# Patient Record
Sex: Female | Born: 1980 | Race: Black or African American | Hispanic: No | Smoking: Current some day smoker
Health system: Southern US, Community
[De-identification: ages and names within clinical notes are randomized; demographics above are authoritative.]

## PROBLEM LIST (undated history)

## (undated) HISTORY — PX: TUBAL LIGATION: SHX77

## (undated) HISTORY — PX: TONSILLECTOMY: SUR1361

---

## 2012-11-29 ENCOUNTER — Encounter: Payer: Self-pay | Admitting: Emergency Medicine

## 2012-11-29 ENCOUNTER — Ambulatory Visit: Payer: Self-pay

## 2012-11-29 ENCOUNTER — Ambulatory Visit (INDEPENDENT_AMBULATORY_CARE_PROVIDER_SITE_OTHER): Payer: Self-pay | Admitting: Emergency Medicine

## 2012-11-29 VITALS — BP 130/76 | HR 86 | Resp 18

## 2012-11-29 DIAGNOSIS — R0602 Shortness of breath: Secondary | ICD-10-CM

## 2012-11-29 DIAGNOSIS — R112 Nausea with vomiting, unspecified: Secondary | ICD-10-CM

## 2012-11-29 DIAGNOSIS — J45901 Unspecified asthma with (acute) exacerbation: Secondary | ICD-10-CM

## 2012-11-29 LAB — COMPREHENSIVE METABOLIC PANEL
ALT: 14 U/L (ref 0–35)
Calcium: 8.8 mg/dL (ref 8.4–10.5)
Creat: 0.84 mg/dL (ref 0.50–1.10)
Glucose, Bld: 108 mg/dL — ABNORMAL HIGH (ref 70–99)
Potassium: 4.2 mEq/L (ref 3.5–5.3)
Sodium: 139 mEq/L (ref 135–145)

## 2012-11-29 LAB — POCT CBC
Granulocyte percent: 80.2 %G — AB (ref 37–80)
HCT, POC: 40.4 % (ref 37.7–47.9)
Lymph, poc: 1.5 (ref 0.6–3.4)
MCHC: 30.7 g/dL — AB (ref 31.8–35.4)
MID (cbc): 0.5 (ref 0–0.9)
MPV: 10.5 fL (ref 0–99.8)
POC Granulocyte: 7.9 — AB (ref 2–6.9)
POC LYMPH PERCENT: 14.7 %L (ref 10–50)
POC MID %: 5.1 %M (ref 0–12)
Platelet Count, POC: 215 10*3/uL (ref 142–424)
RDW, POC: 15.3 %

## 2012-11-29 MED ORDER — IPRATROPIUM BROMIDE 0.02 % IN SOLN
0.5000 mg | Freq: Once | RESPIRATORY_TRACT | Status: AC
  Administered 2012-11-29: 0.5 mg via RESPIRATORY_TRACT

## 2012-11-29 MED ORDER — BENZONATATE 100 MG PO CAPS: 100.0000 mg | ORAL_CAPSULE | Freq: Three times a day (TID) | ORAL | Status: DC | PRN

## 2012-11-29 MED ORDER — ALBUTEROL SULFATE HFA 108 (90 BASE) MCG/ACT IN AERS: 2.0000 | INHALATION_SPRAY | RESPIRATORY_TRACT | Status: DC | PRN

## 2012-11-29 MED ORDER — ALBUTEROL SULFATE (2.5 MG/3ML) 0.083% IN NEBU
2.5000 mg | INHALATION_SOLUTION | Freq: Once | RESPIRATORY_TRACT | Status: AC
  Administered 2012-11-29: 2.5 mg via RESPIRATORY_TRACT

## 2012-11-29 MED ORDER — ONDANSETRON 4 MG PO TBDP: ORAL_TABLET | ORAL | Status: DC

## 2012-11-29 NOTE — Progress Notes (Addendum)
  Subjective:    Patient ID: Abigail Paul, female    DOB: [birth year], 32 y.o.   MRN: [id number]  HPI patient brought back emergently stating she was unable to breathe. She's been short of breath with wheezing today. She has been ill the last 2 to 3 days with nausea and vomiting but no definite fever. She has a history of needing to use an inhaler in the past but not recently. She does not carry a diagnosis of asthma. She is status post tubal ligation. The patient is currently exhausted. She's been working steadily. She has 2 children and is going through a  divorce. She took some cough syrup this morning this also may have made her sick. She denies any chest pain she's only had the nausea and vomiting.     Review of Systems     Objective:   Physical Exam patient has audible wheezing on exam. Her neck was supple. Her throat is normal. Her cardiac exam irregular rate no murmurs. The abdomen was soft liver spleen not enlarged. There is no abdominal tenderness. There are no masses. Her calves are nontender. There is no calf swelling.  Results for orders placed in visit on 11/29/12  POCT CBC      Result Value Range   WBC 9.9  4.6 - 10.2 K/uL   Lymph, poc 1.5  0.6 - 3.4   POC LYMPH PERCENT 14.7  10 - 50 %L   MID (cbc) 0.5  0 - 0.9   POC MID % 5.1  0 - 12 %M   POC Granulocyte 7.9 (*) 2 - 6.9   Granulocyte percent 80.2 (*) 37 - 80 %G   RBC 4.78  4.04 - 5.48 M/uL   Hemoglobin 12.4  12.2 - 16.2 g/dL   HCT, POC 46.9  62.9 - 47.9 %   MCV 84.5  80 - 97 fL   MCH, POC 25.9 (*) 27 - 31.2 pg   MCHC 30.7 (*) 31.8 - 35.4 g/dL   RDW, POC 52.8     Platelet Count, POC 215  142 - 424 K/uL   MPV 10.5  0 - 99.8 fL   [facility] reading (PRIMARY) by  Dr. Cleta Alberts no pneumonia seen air fields appear clear heart size is normal On repeat exam patient was improved after 2 nebulizer treatments. Breath sounds were symmetrical her peak flow was 400     Assessment & Plan:  White count is normal but with a left shift. I  suspect she caught an illness from her daughter. We'll treat with Zofran, albuterol inhaler,tessalon perles, and force fluids. She was given 2 treatments here in the office and will be out of work for 2 days.  Called to check on patient. Some cough and tightness. No fever. Keeping fluids down. To call if any worsening of symptoms.Call at 7:45.

## 2013-01-02 ENCOUNTER — Encounter: Payer: Self-pay | Admitting: Emergency Medicine

## 2013-11-22 ENCOUNTER — Ambulatory Visit (INDEPENDENT_AMBULATORY_CARE_PROVIDER_SITE_OTHER): Payer: 59

## 2013-11-22 ENCOUNTER — Ambulatory Visit (INDEPENDENT_AMBULATORY_CARE_PROVIDER_SITE_OTHER): Payer: 59 | Admitting: Family Medicine

## 2013-11-22 VITALS — BP 136/68 | HR 120 | Temp 98.3°F | Resp 16

## 2013-11-22 DIAGNOSIS — R079 Chest pain, unspecified: Secondary | ICD-10-CM

## 2013-11-22 NOTE — Patient Instructions (Signed)
1. Let me know if you develop shortness of breath, sweating, light-headedness, nausea with the chest pain.  Chest Pain (Nonspecific) It is often hard to give a specific diagnosis for the cause of chest pain. There is always a chance that your pain could be related to something serious, such as a heart attack or a blood clot in the lungs. You need to follow up with your health care provider for further evaluation. CAUSES   Heartburn.  Pneumonia or bronchitis.  Anxiety or stress.  Inflammation around your heart (pericarditis) or lung (pleuritis or pleurisy).  A blood clot in the lung.  A collapsed lung (pneumothorax). It can develop suddenly on its own (spontaneous pneumothorax) or from trauma to the chest.  Shingles infection (herpes zoster virus). The chest wall is composed of bones, muscles, and cartilage. Any of these can be the source of the pain.  The bones can be bruised by injury.  The muscles or cartilage can be strained by coughing or overwork.  The cartilage can be affected by inflammation and become sore (costochondritis). DIAGNOSIS  Lab tests or other studies may be needed to find the cause of your pain. Your health care provider may have you take a test called an ambulatory electrocardiogram (ECG). An ECG records your heartbeat patterns over a 24-hour period. You may also have other tests, such as:  Transthoracic echocardiogram (TTE). During echocardiography, sound waves are used to evaluate how blood flows through your heart.  Transesophageal echocardiogram (TEE).  Cardiac monitoring. This allows your health care provider to monitor your heart rate and rhythm in real time.  Holter monitor. This is a portable device that records your heartbeat and can help diagnose heart arrhythmias. It allows your health care provider to track your heart activity for several days, if needed.  Stress tests by exercise or by giving medicine that makes the heart beat faster. TREATMENT     Treatment depends on what may be causing your chest pain. Treatment may include:  Acid blockers for heartburn.  Anti-inflammatory medicine.  Pain medicine for inflammatory conditions.  Antibiotics if an infection is present.  You may be advised to change lifestyle habits. This includes stopping smoking and avoiding alcohol, caffeine, and chocolate.  You may be advised to keep your head raised (elevated) when sleeping. This reduces the chance of acid going backward from your stomach into your esophagus. Most of the time, nonspecific chest pain will improve within 2-3 days with rest and mild pain medicine.  HOME CARE INSTRUCTIONS   If antibiotics were prescribed, take them as directed. Finish them even if you start to feel better.  For the next few days, avoid physical activities that bring on chest pain. Continue physical activities as directed.  Do not use any tobacco products, including cigarettes, chewing tobacco, or electronic cigarettes.  Avoid drinking alcohol.  Only take medicine as directed by your health care provider.  Follow your health care provider's suggestions for further testing if your chest pain does not go away.  Keep any follow-up appointments you made. If you do not go to an appointment, you could develop lasting (chronic) problems with pain. If there is any problem keeping an appointment, call to reschedule. SEEK MEDICAL CARE IF:   Your chest pain does not go away, even after treatment.  You have a rash with blisters on your chest.  You have a fever. SEEK IMMEDIATE MEDICAL CARE IF:   You have increased chest pain or pain that spreads to your arm, neck,  jaw, back, or abdomen.  You have shortness of breath.  You have an increasing cough, or you cough up blood.  You have severe back or abdominal pain.  You feel nauseous or vomit.  You have severe weakness.  You faint.  You have chills. This is an emergency. Do not wait to see if the pain will  go away. Get medical help at once. Call your local emergency services (911 in [place] Do not drive yourself to the hospital. MAKE SURE YOU:   Understand these instructions.  Will watch your condition.  Will get help right away if you are not doing well or get worse. Document Released: 02/10/2005 Document Revised: 05/08/2013 Document Reviewed: 12/07/2007 [facility] Patient Information 2015 [place], [place]. This information is not intended to replace advice given to you by your health care provider. Make sure you discuss any questions you have with your health care provider.

## 2013-11-22 NOTE — Progress Notes (Signed)
Subjective:    Patient ID: Abigail Paul, female    DOB: 04-15-1981, 33 y.o.   MRN: [id number]  11/22/2013  Chest Pain   Chest Pain  Pertinent negatives include no abdominal pain, cough, diaphoresis, fever, nausea, palpitations, shortness of breath or vomiting.   This 33 y.o. female presents for evaluation of chest pain. At work, with acute onset of sharp shooting chest pain located under L breast with radiation into L arm.  No associated SOB, diaphoresis, nausea.  Duration of chest pain 1 minute; had six episodes in fifteen minutes.  Can occur at rest or with standing; no exertional component.  No recent surgeries or prolonged immobilization. No heartburn or indigestion.  No abdominal pain.  No heavy lifting or pushing lately.  Admits to increased stressors; single mother raising children; lives with grandmother which is very stressful.  Daughter with moderate to severe asthma which requires multiple admissions; pt has missed a lot of work due to daughter's hospitalizations. Trying to get FMLA for daughter's absences.  No family history of coronary artery disease or pulmonary embolism.    Treated last week for B otitis media.  No cough with acute illness.   Feeling better.  PCP: [facility]   Review of Systems  Constitutional: Negative for fever, chills, diaphoresis and fatigue.  Respiratory: Negative for cough, shortness of breath, wheezing and stridor.   Cardiovascular: Positive for chest pain. Negative for palpitations and leg swelling.  Gastrointestinal: Negative for nausea, vomiting and abdominal pain.    History reviewed. No pertinent past medical history. Allergies  Allergen Reactions  . Amoxicillin   . Penicillins    Past Surgical History  Procedure Laterality Date  . Tubal ligation    . Tonsillectomy      Current Outpatient Prescriptions  Medication Sig Dispense Refill  . albuterol (PROVENTIL HFA;VENTOLIN HFA) 108 (90 BASE) MCG/ACT inhaler Inhale 2 puffs into the lungs  every 4 (four) hours as needed for wheezing (cough, shortness of breath or wheezing.).  1 Inhaler  1  . benzonatate (TESSALON) 100 MG capsule Take 1-2 capsules (100-200 mg total) by mouth 3 (three) times daily as needed for cough.  40 capsule  0  . ondansetron (ZOFRAN ODT) 4 MG disintegrating tablet Take 1-2 tablets every 6-8 hours as needed for nausea  20 tablet  0   No current facility-administered medications for this visit.   History   Social History  . Marital Status: Divorced    Spouse Name: N/A    Number of Children: N/A  . Years of Education: N/A   Occupational History  . Not on file.   Social History Main Topics  . Smoking status: Current Some Day Smoker -- 10.00 packs/day    Types: Cigarettes  . Smokeless tobacco: Not on file  . Alcohol Use: 1.8 oz/week    3 Shots of liquor per week  . Drug Use: No  . Sexual Activity: Not on file   Other Topics Concern  . Not on file   Social History Narrative  . No narrative on file   Family History  Problem Relation Age of Onset  . Asthma Mother        Objective:    BP 136/68  Pulse 120  Temp(Src) 98.3 F (36.8 C) (Oral)  Resp 16  SpO2 97%  LMP 10/25/2013 Physical Exam  Constitutional: She is oriented to person, place, and time. She appears well-developed and well-nourished. No distress.  HENT:  Head: Normocephalic and atraumatic.  Right Ear: External  ear normal.  Left Ear: External ear normal.  Nose: Nose normal.  Mouth/Throat: Oropharynx is clear and moist.  Eyes: Conjunctivae and EOM are normal. Pupils are equal, round, and reactive to light.  Neck: Normal range of motion. Neck supple. Carotid bruit is not present. No thyromegaly present.  Cardiovascular: Normal rate, regular rhythm and intact distal pulses.  Exam reveals no gallop and no friction rub.   Murmur heard.  Systolic murmur is present with a grade of 2/6  II/VI holosystolic murmur.  Pulmonary/Chest: Effort normal and breath sounds normal. She has  no wheezes. She has no rales. She exhibits no tenderness.  Abdominal: Soft. Bowel sounds are normal. She exhibits no distension and no mass. There is no tenderness. There is no rebound and no guarding.  Musculoskeletal:       Right shoulder: Normal.       Left shoulder: Normal.       Cervical back: She exhibits normal range of motion, no tenderness, no bony tenderness, no pain and no spasm.       Lumbar back: Normal. She exhibits normal range of motion, no tenderness and no bony tenderness.  Lymphadenopathy:    She has no cervical adenopathy.  Neurological: She is alert and oriented to person, place, and time. No cranial nerve deficit.  Skin: Skin is warm and dry. No rash noted. She is not diaphoretic. No erythema. No pallor.  Psychiatric: She has a normal mood and affect. Her behavior is normal.   Results for orders placed in visit on 11/22/13  CBC WITH DIFFERENTIAL      Result Value Ref Range   WBC 9.7  4.0 - 10.5 K/uL   RBC 4.98  3.87 - 5.11 MIL/uL   Hemoglobin 12.2  12.0 - 15.0 g/dL   HCT 37.4  36.0 - 46.0 %   MCV 75.1 (*) 78.0 - 100.0 fL   MCH 24.5 (*) 26.0 - 34.0 pg   MCHC 32.6  30.0 - 36.0 g/dL   RDW 15.2  11.5 - 15.5 %   Platelets 285  150 - 400 K/uL   Neutrophils Relative % 69  43 - 77 %   Neutro Abs 6.7  1.7 - 7.7 K/uL   Lymphocytes Relative 24  12 - 46 %   Lymphs Abs 2.3  0.7 - 4.0 K/uL   Monocytes Relative 5  3 - 12 %   Monocytes Absolute 0.5  0.1 - 1.0 K/uL   Eosinophils Relative 2  0 - 5 %   Eosinophils Absolute 0.2  0.0 - 0.7 K/uL   Basophils Relative 0  0 - 1 %   Basophils Absolute 0.0  0.0 - 0.1 K/uL   Smear Review Criteria for review not met    COMPREHENSIVE METABOLIC PANEL      Result Value Ref Range   Sodium 140  135 - 145 mEq/L   Potassium 4.4  3.5 - 5.3 mEq/L   Chloride 106  96 - 112 mEq/L   CO2 25  19 - 32 mEq/L   Glucose, Bld 99  70 - 99 mg/dL   BUN 13  6 - 23 mg/dL   Creat 0.93  0.50 - 1.10 mg/dL   Total Bilirubin 0.3  0.2 - 1.2 mg/dL   Alkaline  Phosphatase 76  39 - 117 U/L   AST 11  0 - 37 U/L   ALT 10  0 - 35 U/L   Total Protein 6.6  6.0 - 8.3 g/dL   Albumin  3.7  3.5 - 5.2 g/dL   Calcium 8.9  8.4 - 10.5 mg/dL   EKG: sinus tachycardia; rate 102; no ST changes.  [facility] reading (PRIMARY) by  Dr. Tamala Julian.  CXR: NAD      Assessment & Plan:  Chest pain, unspecified chest pain type - Plan: EKG 12-Lead, CBC with Differential, Comprehensive metabolic panel, DG Chest 2 View  1. Chest pain:  New. Atypical in nature.  Non-exertional. Ddx includes acute stress reaction versus musculoskeletal etiology.  Obtain labs to rule out secondary causes of chest pain.  RTC for acute worsening.  No orders of the defined types were placed in this encounter.    No Follow-up on file.   Reginia Forts, M.D.  [facility] [address] [place], [place]  [address] [phone] phone [phone] fax

## 2013-11-23 LAB — CBC WITH DIFFERENTIAL/PLATELET
BASOS PCT: 0 % (ref 0–1)
Basophils Absolute: 0 10*3/uL (ref 0.0–0.1)
Eosinophils Absolute: 0.2 10*3/uL (ref 0.0–0.7)
Eosinophils Relative: 2 % (ref 0–5)
HCT: 37.4 % (ref 36.0–46.0)
HEMOGLOBIN: 12.2 g/dL (ref 12.0–15.0)
Lymphocytes Relative: 24 % (ref 12–46)
Lymphs Abs: 2.3 10*3/uL (ref 0.7–4.0)
MCH: 24.5 pg — ABNORMAL LOW (ref 26.0–34.0)
MCHC: 32.6 g/dL (ref 30.0–36.0)
MCV: 75.1 fL — ABNORMAL LOW (ref 78.0–100.0)
MONOS PCT: 5 % (ref 3–12)
Monocytes Absolute: 0.5 10*3/uL (ref 0.1–1.0)
NEUTROS ABS: 6.7 10*3/uL (ref 1.7–7.7)
Neutrophils Relative %: 69 % (ref 43–77)
PLATELETS: 285 10*3/uL (ref 150–400)
RBC: 4.98 MIL/uL (ref 3.87–5.11)
RDW: 15.2 % (ref 11.5–15.5)
WBC: 9.7 10*3/uL (ref 4.0–10.5)

## 2013-11-23 LAB — COMPREHENSIVE METABOLIC PANEL
ALBUMIN: 3.7 g/dL (ref 3.5–5.2)
ALK PHOS: 76 U/L (ref 39–117)
ALT: 10 U/L (ref 0–35)
AST: 11 U/L (ref 0–37)
BILIRUBIN TOTAL: 0.3 mg/dL (ref 0.2–1.2)
BUN: 13 mg/dL (ref 6–23)
CO2: 25 mEq/L (ref 19–32)
Calcium: 8.9 mg/dL (ref 8.4–10.5)
Chloride: 106 mEq/L (ref 96–112)
Creat: 0.93 mg/dL (ref 0.50–1.10)
Glucose, Bld: 99 mg/dL (ref 70–99)
Potassium: 4.4 mEq/L (ref 3.5–5.3)
SODIUM: 140 meq/L (ref 135–145)
TOTAL PROTEIN: 6.6 g/dL (ref 6.0–8.3)

## 2013-12-17 ENCOUNTER — Ambulatory Visit (INDEPENDENT_AMBULATORY_CARE_PROVIDER_SITE_OTHER): Payer: 59 | Admitting: Physician Assistant

## 2013-12-17 VITALS — BP 124/98 | HR 108 | Temp 97.7°F | Resp 16

## 2013-12-17 DIAGNOSIS — L0201 Cutaneous abscess of face: Secondary | ICD-10-CM

## 2013-12-17 DIAGNOSIS — L03211 Cellulitis of face: Principal | ICD-10-CM

## 2013-12-17 DIAGNOSIS — J45909 Unspecified asthma, uncomplicated: Secondary | ICD-10-CM | POA: Insufficient documentation

## 2013-12-17 MED ORDER — DOXYCYCLINE HYCLATE 100 MG PO TABS: 100.0000 mg | ORAL_TABLET | Freq: Two times a day (BID) | ORAL | Status: DC

## 2013-12-17 NOTE — Progress Notes (Signed)
   Subjective:    Patient ID: Abigail Paul, female    DOB: 07/25/1980, 33 y.o.   MRN: [id number]  HPI Pt presents to clinic with area on the left side of her face in her temple area that has been swollen and tender for the last 4 days and seems to be getting worse.  She had no trauma to the area.  She has been using warm compresses but it does not seem to be getting better.  She otherwise feels good and has had no F/C. No drainage from the area.  Review of Systems  Constitutional: Negative for fever and chills.  Skin: Positive for wound.       Objective:   Physical Exam  Vitals reviewed. Constitutional: She appears well-developed and well-nourished.  HENT:  Head: Normocephalic and atraumatic.    Right Ear: External ear normal.  Left Ear: External ear normal.  Pulmonary/Chest: Effort normal.  Lymphadenopathy:       Head (right side): No tonsillar and no preauricular adenopathy present.       Head (left side): No tonsillar and no preauricular adenopathy present.    She has no cervical adenopathy.       Right: No supraclavicular adenopathy present.       Left: No supraclavicular adenopathy present.  Skin: Skin is warm and dry.  Psychiatric: She has a normal mood and affect. Her behavior is normal. Judgment and thought content normal.       Assessment & Plan:  Cellulitis and abscess of face - Plan: doxycycline (VIBRA-TABS) 100 MG tablet  Pt has a small abscess on the left side of her temple - due to the location she will continue the warm compresses and start abx and monitor for change - if it is not improving in 48h we will consider an I&D and definitely if it is getting worse in 48h after starting abx.  Benny LennertSarah Weber PA-C  [facility] 12/17/2013 1:30 PM

## 2013-12-17 NOTE — Addendum Note (Signed)
Addended by: Johnnette LitterARDWELL, Keyshawna Prouse M on: 12/17/2013 08:02 PM   Modules accepted: Orders

## 2013-12-17 NOTE — Progress Notes (Signed)
   Subjective:    Patient ID: Abigail Paul, female    DOB: Jun 26, 1980, 33 y.o.   MRN: [id number]  HPI  Pt presents to clinic for a 2nd visit today. She has started the Doxy and tolerated it well but the area on the left side of her face is hurting more and she would like to go ahead and have it opened.  Review of Systems     Objective:   Physical Exam   Procedure:  Consent obtained.  Local anesthesia with 2% lido with epi.  Betadine prep and #11 blade used to make a 5mm incision.  Purulent drainage expressed from the wound.  Wound culture obtained and drsg placed on the incision.     Assessment & Plan:  Cellulitis and abscess of face - Plan: doxycycline (VIBRA-TABS) 100 MG tablet, Wound culture  Pt to continue Doxy and warm compresses.  Benny LennertSarah Weber PA-C  [facility] 12/17/2013 8:35 PM

## 2013-12-20 LAB — WOUND CULTURE
Gram Stain: NONE SEEN
Gram Stain: NONE SEEN
ORGANISM ID, BACTERIA: NO GROWTH

## 2013-12-22 ENCOUNTER — Telehealth: Payer: Self-pay

## 2013-12-22 NOTE — Telephone Encounter (Signed)
Pt notified of no growth on her culture

## 2013-12-27 ENCOUNTER — Ambulatory Visit (INDEPENDENT_AMBULATORY_CARE_PROVIDER_SITE_OTHER): Payer: 59 | Admitting: Emergency Medicine

## 2013-12-27 VITALS — BP 118/80 | HR 110 | Temp 98.0°F | Resp 16

## 2013-12-27 DIAGNOSIS — L089 Local infection of the skin and subcutaneous tissue, unspecified: Secondary | ICD-10-CM

## 2013-12-27 DIAGNOSIS — L723 Sebaceous cyst: Secondary | ICD-10-CM

## 2013-12-27 NOTE — Progress Notes (Signed)
[facility] [address], [place] [id number] [phone]  Date:  12/27/2013   Name:  Abigail Paul   DOB:  03/30/1981   MRN:  [id number]  PCP:  No primary provider on file.    Chief Complaint: Cyst   History of Present Illness:  Abigail Paul is a 33 y.o. very pleasant female patient who presents with the following:  Had I&D of cystic mass a week ago and completed her course of doxycycline.  Culture returned negative.  She has a recurrence of the mass on her cheek.   No improvement with over the counter medications or other home remedies.   Patient Active Problem List   Diagnosis Date Noted  . Extrinsic asthma, unspecified 12/17/2013    History reviewed. No pertinent past medical history.  Past Surgical History  Procedure Laterality Date  . Tubal ligation    . Tonsillectomy      History  Substance Use Topics  . Smoking status: Current Some Day Smoker -- 10.00 packs/day    Types: Cigarettes  . Smokeless tobacco: Not on file  . Alcohol Use: 1.8 oz/week    3 Shots of liquor per week    Family History  Problem Relation Age of Onset  . Asthma Mother     Allergies  Allergen Reactions  . Amoxicillin Shortness Of Breath  . Penicillins Shortness Of Breath    Medication list has been reviewed and updated.  Current Outpatient Prescriptions on File Prior to Visit  Medication Sig Dispense Refill  . albuterol (PROVENTIL HFA;VENTOLIN HFA) 108 (90 BASE) MCG/ACT inhaler Inhale 2 puffs into the lungs every 4 (four) hours as needed for wheezing (cough, shortness of breath or wheezing.).  1 Inhaler  1  . doxycycline (VIBRA-TABS) 100 MG tablet Take 1 tablet (100 mg total) by mouth 2 (two) times daily.  20 tablet  0   No current facility-administered medications on file prior to visit.    Review of Systems:  As per HPI, otherwise negative.    Physical Examination: Filed Vitals:   12/27/13 1652  BP: 118/80  Pulse: 110  Temp: 98 F (36.7 C)   Resp: 16   There were no vitals filed for this visit. There is no height or weight on file to calculate BMI. Ideal Body Weight:     GEN: WDWN, NAD, Non-toxic, Alert & Oriented x 3 HEENT: Atraumatic, Normocephalic.  Ears and Nose: No external deformity. EXTR: No clubbing/cyanosis/edema NEURO: Normal gait.  PSYCH: Normally interactive. Conversant. Not depressed or anxious appearing.  Calm demeanor.  Left temple:  Cystic mass  Assessment and Plan: Sebaceous cyst temple I&D Intent to excise cyst but unable.  I&D accomplished  Signed,  Phillips OdorJeffery Anderson, MD

## 2014-02-11 ENCOUNTER — Ambulatory Visit (INDEPENDENT_AMBULATORY_CARE_PROVIDER_SITE_OTHER): Payer: 59 | Admitting: Family Medicine

## 2014-02-11 VITALS — BP 137/85 | HR 106 | Temp 97.5°F | Resp 18 | Ht 66.0 in | Wt 275.4 lb

## 2014-02-11 DIAGNOSIS — S335XXA Sprain of ligaments of lumbar spine, initial encounter: Secondary | ICD-10-CM

## 2014-02-11 DIAGNOSIS — S39012A Strain of muscle, fascia and tendon of lower back, initial encounter: Secondary | ICD-10-CM

## 2014-02-11 DIAGNOSIS — M549 Dorsalgia, unspecified: Secondary | ICD-10-CM

## 2014-02-11 MED ORDER — KETOROLAC TROMETHAMINE 60 MG/2ML IM SOLN
60.0000 mg | Freq: Once | INTRAMUSCULAR | Status: AC
  Administered 2014-02-11: 60 mg via INTRAMUSCULAR

## 2014-02-11 MED ORDER — MELOXICAM 15 MG PO TABS: 15.0000 mg | ORAL_TABLET | Freq: Every day | ORAL | Status: DC

## 2014-02-11 MED ORDER — CYCLOBENZAPRINE HCL 10 MG PO TABS: 10.0000 mg | ORAL_TABLET | Freq: Three times a day (TID) | ORAL | Status: DC | PRN

## 2014-02-11 NOTE — Progress Notes (Signed)
   Subjective:    Patient ID: Abigail Paul, female    DOB: 28-Aug-1980, 33 y.o.   MRN: [id number]  Back Pain  Patient presents today with 4 day history of low back pain. Pain was off and on 4 days ago then has gotten progressively worse. Kept her from sleeping last night. Can't get comfortable sitting or standing. Took ibuprofen 800 mg every  6-8 hours without relief. No improvement with hot shower. No radiation of pain into legs. Some movement of pain up back. No numbness or tingling in arms or legs. Some nausea with pain. Took last dost of ibuprofen last night. Does not recall trauma or injury to area. No heavy lifting, pushing or pulling.    Review of Systems  Musculoskeletal: Positive for back pain.   No dysuria, no hematuria, no frequency. No fever. No numbness or tingling, no weakness, no falls, no loss of bowel/bladder function, some nausea with pain.     Objective:   Physical Exam  Vitals reviewed. Constitutional: She is oriented to person, place, and time. She appears well-developed and well-nourished.  Patient appears uncomfortable, pacing.   HENT:  Head: Normocephalic and atraumatic.  Right Ear: External ear normal.  Left Ear: External ear normal.  Eyes: Conjunctivae and EOM are normal. Pupils are equal, round, and reactive to light.  Neck: Normal range of motion. Neck supple.  Cardiovascular: Normal rate, regular rhythm and normal heart sounds.   Pulmonary/Chest: Effort normal and breath sounds normal.  Abdominal: Soft. Bowel sounds are normal. She exhibits no distension and no mass. There is no tenderness. There is no rebound and no guarding.  Musculoskeletal: Normal range of motion. She exhibits no edema.       Lumbar back: She exhibits tenderness and pain. She exhibits normal range of motion, no bony tenderness, no edema and no deformity.  Neurological: She is alert and oriented to person, place, and time. She has normal reflexes. Coordination normal.  Skin: Skin is warm  and dry. She is not diaphoretic.  Psychiatric: She has a normal mood and affect. Her behavior is normal. Judgment and thought content normal.   Toradol 60 mg IM given in office.     Assessment & Plan:  1. Low back strain, initial encounter -Provided written and verbal information regarding diagnosis and treatment. - ketorolac (TORADOL) injection 60 mg; Inject 2 mLs (60 mg total) into the muscle once. - meloxicam (MOBIC) 15 MG tablet; Take 1 tablet (15 mg total) by mouth daily.  Dispense: 30 tablet; Refill: 0 - cyclobenzaprine (FLEXERIL) 10 MG tablet; Take 1 tablet (10 mg total) by mouth 3 (three) times daily as needed for muscle spasms.  Dispense: 30 tablet; Refill: 0 -Back owners manual provided - RTC if worsening pain or if no improvement in 4-5 days or if new numbness/tingling/loss of bowel/blader function.   Emi Belfast, FNP-BC  [facility]  02/11/2014 8:39 AM

## 2014-02-11 NOTE — Patient Instructions (Signed)
Start meloxicam tonight- take once a day Can take flexeril up to 3 times a day- it may make you sleepy- do not take if you will be driving Try heating pad Do not stay in one position for more than 20-30 minutes while awake  Lumbosacral Strain Lumbosacral strain is a strain of any of the parts that make up your lumbosacral vertebrae. Your lumbosacral vertebrae are the bones that make up the lower third of your backbone. Your lumbosacral vertebrae are held together by muscles and tough, fibrous tissue (ligaments).  CAUSES  A sudden blow to your back can cause lumbosacral strain. Also, anything that causes an excessive stretch of the muscles in the low back can cause this strain. This is typically seen when people exert themselves strenuously, fall, lift heavy objects, bend, or crouch repeatedly. RISK FACTORS  Physically demanding work.  Participation in pushing or pulling sports or sports that require a sudden twist of the back (tennis, golf, baseball).  Weight lifting.  Excessive lower back curvature.  Forward-tilted pelvis.  Weak back or abdominal muscles or both.  Tight hamstrings. SIGNS AND SYMPTOMS  Lumbosacral strain may cause pain in the area of your injury or pain that moves (radiates) down your leg.  DIAGNOSIS Your health care provider can often diagnose lumbosacral strain through a physical exam. In some cases, you may need tests such as X-ray exams.  TREATMENT  Treatment for your lower back injury depends on many factors that your clinician will have to evaluate. However, most treatment will include the use of anti-inflammatory medicines. HOME CARE INSTRUCTIONS   Avoid hard physical activities (tennis, racquetball, waterskiing) if you are not in proper physical condition for it. This may aggravate or create problems.  If you have a back problem, avoid sports requiring sudden body movements. Swimming and walking are generally safer activities.  Maintain good  posture.  Maintain a healthy weight.  For acute conditions, you may put ice on the injured area.  Put ice in a plastic bag.  Place a towel between your skin and the bag.  Leave the ice on for 20 minutes, 2-3 times a day.  When the low back starts healing, stretching and strengthening exercises may be recommended. SEEK MEDICAL CARE IF:  Your back pain is getting worse.  You experience severe back pain not relieved with medicines. SEEK IMMEDIATE MEDICAL CARE IF:   You have numbness, tingling, weakness, or problems with the use of your arms or legs.  There is a change in bowel or bladder control.  You have increasing pain in any area of the body, including your belly (abdomen).  You notice shortness of breath, dizziness, or feel faint.  You feel sick to your stomach (nauseous), are throwing up (vomiting), or become sweaty.  You notice discoloration of your toes or legs, or your feet get very cold. MAKE SURE YOU:   Understand these instructions.  Will watch your condition.  Will get help right away if you are not doing well or get worse. Document Released: 02/10/2005 Document Revised: 05/08/2013 Document Reviewed: 12/20/2012 [facility] Patient Information 2015 [place], [place]. This information is not intended to replace advice given to you by your health care provider. Make sure you discuss any questions you have with your health care provider.

## 2014-02-19 ENCOUNTER — Ambulatory Visit (INDEPENDENT_AMBULATORY_CARE_PROVIDER_SITE_OTHER): Payer: 59 | Admitting: Physician Assistant

## 2014-02-19 VITALS — BP 120/80 | HR 108 | Temp 98.7°F | Resp 16

## 2014-02-19 DIAGNOSIS — B9789 Other viral agents as the cause of diseases classified elsewhere: Principal | ICD-10-CM

## 2014-02-19 DIAGNOSIS — J069 Acute upper respiratory infection, unspecified: Secondary | ICD-10-CM

## 2014-02-19 MED ORDER — HYDROCOD POLST-CHLORPHEN POLST 10-8 MG/5ML PO LQCR
5.0000 mL | Freq: Two times a day (BID) | ORAL | Status: DC | PRN
Start: 2014-02-19 — End: 2014-05-20

## 2014-02-19 NOTE — Progress Notes (Signed)
   Subjective:    Patient ID: Abigail Paul, female    DOB: 01/26/1981, 33 y.o.   MRN: [id number]  HPI Pt presents to clinic with a uri with a cough that is keeping her up at night.  She is using OTC preps for the symptoms and she is improved just not sleeping.  She has nasal congestion and has yellow/green rhinorrhea that is thick with PND.  Her sputum is coming from her throat and not her lungs.  She has asthma that is activated with colds but she is currently having no symptoms.  Review of Systems  Constitutional: Negative for fever and chills.  HENT: Positive for congestion, postnasal drip and rhinorrhea. Negative for sinus pressure.   Respiratory: Positive for cough. Negative for shortness of breath and wheezing.   Neurological: Negative for headaches.       Objective:   Physical Exam  Vitals reviewed. Constitutional: She is oriented to person, place, and time. She appears well-developed and well-nourished.  HENT:  Head: Normocephalic and atraumatic.  Right Ear: Hearing, tympanic membrane, external ear and ear canal normal.  Left Ear: Hearing, tympanic membrane, external ear and ear canal normal.  Mouth/Throat: Uvula is midline, oropharynx is clear and moist and mucous membranes are normal.  Cardiovascular: Normal rate, regular rhythm and normal heart sounds.   No murmur heard. Pulmonary/Chest: Effort normal and breath sounds normal. She has no wheezes.  Neurological: She is alert and oriented to person, place, and time.  Skin: Skin is warm and dry.  Psychiatric: She has a normal mood and affect. Her behavior is normal. Judgment and thought content normal.       Assessment & Plan:  Viral URI with cough - Plan: chlorpheniramine-HYDROcodone (TUSSIONEX PENNKINETIC ER) 10-8 MG/5ML LQCR  Continue symptomatic care.  F/u if needed.  Benny LennertSarah Zhara Gieske PA-C  [facility] 02/19/2014 9:46 PM

## 2014-03-08 ENCOUNTER — Ambulatory Visit (INDEPENDENT_AMBULATORY_CARE_PROVIDER_SITE_OTHER): Payer: 59 | Admitting: Emergency Medicine

## 2014-03-08 VITALS — BP 120/78 | HR 98 | Temp 98.0°F | Resp 18 | Ht 66.0 in | Wt 275.4 lb

## 2014-03-08 DIAGNOSIS — J018 Other acute sinusitis: Secondary | ICD-10-CM

## 2014-03-08 DIAGNOSIS — J209 Acute bronchitis, unspecified: Secondary | ICD-10-CM

## 2014-03-08 MED ORDER — PSEUDOEPHEDRINE-GUAIFENESIN ER 60-600 MG PO TB12: 1.0000 | ORAL_TABLET | Freq: Two times a day (BID) | ORAL | Status: DC

## 2014-03-08 MED ORDER — LEVOFLOXACIN 500 MG PO TABS: 500.0000 mg | ORAL_TABLET | Freq: Every day | ORAL | Status: AC

## 2014-03-08 MED ORDER — LEVOFLOXACIN 500 MG PO TABS: 500.0000 mg | ORAL_TABLET | Freq: Every day | ORAL | Status: DC

## 2014-03-08 MED ORDER — PROMETHAZINE-CODEINE 6.25-10 MG/5ML PO SYRP: 5.0000 mL | ORAL_SOLUTION | ORAL | Status: DC | PRN

## 2014-03-08 NOTE — Patient Instructions (Signed)

## 2014-03-08 NOTE — Progress Notes (Signed)
[facility] [address], [place] [id number] [phone]  Date:  03/08/2014   Name:  Abigail Paul   DOB:  12-27-1980   MRN:  [id number]  PCP:  No primary provider on file.    Chief Complaint: Cough   History of Present Illness:  Abigail RegalLatoria Paul is a 33 y.o. very pleasant female patient who presents with the following:  Ill since last night with cough productive purulent sputum.  Has no wheezing or shortness of breath. No fever or chills Fatigue and malaise. Purulent nasal drainage and congestion No nausea or vomiting. No improvement with over the counter medications or other home remedies.  Denies other complaint or health concern today.   Patient Active Problem List   Diagnosis Date Noted  . Extrinsic asthma, unspecified 12/17/2013    History reviewed. No pertinent past medical history.  Past Surgical History  Procedure Laterality Date  . Tubal ligation    . Tonsillectomy      History  Substance Use Topics  . Smoking status: Current Some Day Smoker -- 10.00 packs/day    Types: Cigarettes  . Smokeless tobacco: Not on file  . Alcohol Use: 1.8 oz/week    3 Shots of liquor per week    Family History  Problem Relation Age of Onset  . Asthma Mother     Allergies  Allergen Reactions  . Amoxicillin Shortness Of Breath  . Penicillins Shortness Of Breath    Medication list has been reviewed and updated.  Current Outpatient Prescriptions on File Prior to Visit  Medication Sig Dispense Refill  . cyclobenzaprine (FLEXERIL) 10 MG tablet Take 1 tablet (10 mg total) by mouth 3 (three) times daily as needed for muscle spasms.  30 tablet  0  . meloxicam (MOBIC) 15 MG tablet Take 1 tablet (15 mg total) by mouth daily.  30 tablet  0  . chlorpheniramine-HYDROcodone (TUSSIONEX PENNKINETIC ER) 10-8 MG/5ML LQCR Take 5 mLs by mouth every 12 (twelve) hours as needed for cough.  70 mL  0   No current facility-administered medications on file prior to  visit.    Review of Systems:  As per HPI, otherwise negative.    Physical Examination: Filed Vitals:   03/08/14 1326  BP: 120/78  Pulse: 98  Temp: 98 F (36.7 C)  Resp: 18   Filed Vitals:   03/08/14 1326  Height: 5\' 6"  (1.676 m)  Weight: 275 lb 6.4 oz (124.921 kg)   Body mass index is 44.47 kg/(m^2). Ideal Body Weight: Weight in (lb) to have BMI = 25: 154.6  GEN: WDWN, NAD, ill appearing, A & O x 3 HEENT: Atraumatic, Normocephalic. Neck supple. No masses, No LAD. Ears and Nose: No external deformity. CV: RRR, No M/G/R. No JVD. No thrill. No extra heart sounds. PULM: CTA B, no wheezes, crackles, rhonchi. No retractions. No resp. distress. No accessory muscle use. ABD: S, NT, ND, +BS. No rebound. No HSM. EXTR: No c/c/e NEURO Normal gait.  PSYCH: Normally interactive. Conversant. Not depressed or anxious appearing.  Calm demeanor.    Assessment and Plan: Sinusitis Bronchitis  augmentin mucinex d Phen c cod  Signed,  Phillips OdorJeffery Kialee Kham, MD

## 2014-03-08 NOTE — Addendum Note (Signed)
Addended by: Eddie CandleLUCK, Huntleigh Doolen L on: 03/08/2014 02:03 PM   Modules accepted: Orders

## 2014-05-20 ENCOUNTER — Ambulatory Visit (INDEPENDENT_AMBULATORY_CARE_PROVIDER_SITE_OTHER): Payer: 59 | Admitting: Family Medicine

## 2014-05-20 ENCOUNTER — Encounter: Payer: Self-pay | Admitting: Family Medicine

## 2014-05-20 VITALS — BP 136/90 | HR 89 | Temp 98.0°F | Resp 18 | Ht 64.5 in | Wt 274.4 lb

## 2014-05-20 DIAGNOSIS — F411 Generalized anxiety disorder: Secondary | ICD-10-CM

## 2014-05-20 DIAGNOSIS — G4700 Insomnia, unspecified: Secondary | ICD-10-CM

## 2014-05-20 MED ORDER — ZOLPIDEM TARTRATE 5 MG PO TABS: ORAL_TABLET | ORAL | Status: DC

## 2014-05-20 MED ORDER — FLUOXETINE HCL 20 MG PO TABS: 20.0000 mg | ORAL_TABLET | Freq: Every day | ORAL | Status: DC

## 2014-05-20 NOTE — Patient Instructions (Signed)
Insomnia Insomnia is frequent trouble falling and/or staying asleep. Insomnia can be a long term problem or a short term problem. Both are common. Insomnia can be a short term problem when the wakefulness is related to a certain stress or worry. Long term insomnia is often related to ongoing stress during waking hours and/or poor sleeping habits. Overtime, sleep deprivation itself can make the problem worse. Every little thing feels more severe because you are overtired and your ability to cope is decreased. CAUSES   Stress, anxiety, and depression.  Poor sleeping habits.  Distractions such as TV in the bedroom.  Naps close to bedtime.  Engaging in emotionally charged conversations before bed.  Technical reading before sleep.  Alcohol and other sedatives. They may make the problem worse. They can hurt normal sleep patterns and normal dream activity.  Stimulants such as caffeine for several hours prior to bedtime.  Pain syndromes and shortness of breath can cause insomnia.  Exercise late at night.  Changing time zones may cause sleeping problems (jet lag). It is sometimes helpful to have someone observe your sleeping patterns. They should look for periods of not breathing during the night (sleep apnea). They should also look to see how long those periods last. If you live alone or observers are uncertain, you can also be observed at a sleep clinic where your sleep patterns will be professionally monitored. Sleep apnea requires a checkup and treatment. Give your caregivers your medical history. Give your caregivers observations your family has made about your sleep.  SYMPTOMS   Not feeling rested in the morning.  Anxiety and restlessness at bedtime.  Difficulty falling and staying asleep. TREATMENT   Your caregiver may prescribe treatment for an underlying medical disorders. Your caregiver can give advice or help if you are using alcohol or other drugs for self-medication. Treatment  of underlying problems will usually eliminate insomnia problems.  Medications can be prescribed for short time use. They are generally not recommended for lengthy use.  Over-the-counter sleep medicines are not recommended for lengthy use. They can be habit forming.  You can promote easier sleeping by making lifestyle changes such as:  Using relaxation techniques that help with breathing and reduce muscle tension.  Exercising earlier in the day.  Changing your diet and the time of your last meal. No night time snacks.  Establish a regular time to go to bed.  Counseling can help with stressful problems and worry.  Soothing music and white noise may be helpful if there are background noises you cannot remove.  Stop tedious detailed work at least one hour before bedtime. HOME CARE INSTRUCTIONS   Keep a diary. Inform your caregiver about your progress. This includes any medication side effects. See your caregiver regularly. Take note of:  Times when you are asleep.  Times when you are awake during the night.  The quality of your sleep.  How you feel the next day. This information will help your caregiver care for you.  Get out of bed if you are still awake after 15 minutes. Read or do some quiet activity. Keep the lights down. Wait until you feel sleepy and go back to bed.  Keep regular sleeping and waking hours. Avoid naps.  Exercise regularly.  Avoid distractions at bedtime. Distractions include watching television or engaging in any intense or detailed activity like attempting to balance the household checkbook.  Develop a bedtime ritual. Keep a familiar routine of bathing, brushing your teeth, climbing into bed at the same   time each night, listening to soothing music. Routines increase the success of falling to sleep faster.  Use relaxation techniques. This can be using breathing and muscle tension release routines. It can also include visualizing peaceful scenes. You can  also help control troubling or intruding thoughts by keeping your mind occupied with boring or repetitive thoughts like the old concept of counting sheep. You can make it more creative like imagining planting one beautiful flower after another in your backyard garden.  During your day, work to eliminate stress. When this is not possible use some of the previous suggestions to help reduce the anxiety that accompanies stressful situations. MAKE SURE YOU:   Understand these instructions.  Will watch your condition.  Will get help right away if you are not doing well or get worse. Document Released: 04/30/2000 Document Revised: 07/26/2011 Document Reviewed: 05/31/2007 [employer] Patient Information 2015 [employer]. This information is not intended to replace advice given to you by your health care provider. Make sure you discuss any questions you have with your health care provider.  

## 2014-05-20 NOTE — Progress Notes (Signed)
   Subjective:    Patient ID: Abigail Paul, female    DOB: [birth year], 34 y.o.   MRN: [id number]  HPI This is a very pleasant 34 yo female who is employed at [employer]. She presents today with insomnia and anxiety/depression. She has difficulty falling aslepp Sleeps 2-3 hours a night. Has tried benadryl but felt groggy in the morning.  Under a lot of stress. Lives with her grandmother who is sick and is a single mom to 38 yo daughter and 72 yo son. Son is helpful at home with his sister.  Feels overwhelmed and stressed everyday at work. Feels very fatigued. Has noticed she smokes more when at work due to stress. Feels like she has some good support systems at work, but that it is always a stressful environment. She spends time with her kids and makes sure she has some alone time to help deal with stress. She does not get regular exercise.   Denies suicidal or homicidal ideation. Feels like she has a responsibility to her grandmother and her children.  Review of Systems    Objective:   Physical Exam Physical Exam  Vitals reviewed. Constitutional: She is oriented to person, place, and time. She appears well-developed and well-nourished.  HENT:  Head: Normocephalic and atraumatic.  Eyes: Conjunctivae are normal.  Neck: Normal range of motion. Neck supple.  Cardiovascular: Normal rate.   Pulmonary/Chest: Effort normal.  Musculoskeletal: Normal range of motion.  Neurological: She is alert and oriented to person, place, and time.  Skin: Skin is warm and dry.  Psychiatric: She has a normal mood and affect. Her behavior is normal. Judgment and thought content normal.   BP 136/90 mmHg  Pulse 89  Temp(Src) 98 F (36.7 C) (Oral)  Resp 18  Ht 5' 4.5" (1.638 m)  Wt 274 lb 6.4 oz (124.467 kg)  BMI 46.39 kg/m2  SpO2 99%  LMP 05/06/2014     Assessment & Plan:  1. Insomnia - zolpidem (AMBIEN) 5 MG tablet; Take 1/2 - 1 tablet as needed nightly for sleep  Dispense: 15 tablet; Refill: 1 -  provided written and verbal instructions for improved sleep hygiene.   2. Generalized anxiety disorder - FLUoxetine (PROZAC) 20 MG tablet; Take 1 tablet (20 mg total) by mouth daily.  Dispense: 30 tablet; Refill: 3  - follow up in 6 weeks for CPE, she will let me know if she has any intolerance to meds or if sleep is not improving.  Emi Belfast, FNP-BC  [facility]  05/20/2014 4:47 PM

## 2014-06-04 ENCOUNTER — Other Ambulatory Visit: Payer: Self-pay | Admitting: Family Medicine

## 2014-06-04 DIAGNOSIS — H6502 Acute serous otitis media, left ear: Secondary | ICD-10-CM

## 2014-06-04 MED ORDER — AZITHROMYCIN 250 MG PO TABS: ORAL_TABLET | ORAL | Status: DC

## 2014-06-19 ENCOUNTER — Ambulatory Visit (INDEPENDENT_AMBULATORY_CARE_PROVIDER_SITE_OTHER): Payer: 59 | Admitting: Physician Assistant

## 2014-06-19 VITALS — BP 125/83 | HR 94 | Temp 98.1°F | Resp 18

## 2014-06-19 DIAGNOSIS — H8110 Benign paroxysmal vertigo, unspecified ear: Secondary | ICD-10-CM

## 2014-06-19 NOTE — Patient Instructions (Signed)
Benign Positional Vertigo Vertigo means you feel like you or your surroundings are moving when they are not. Benign positional vertigo is the most common form of vertigo. Benign means that the cause of your condition is not serious. Benign positional vertigo is more common in older adults. CAUSES  Benign positional vertigo is the result of an upset in the labyrinth system. This is an area in the middle ear that helps control your balance. This may be caused by a viral infection, head injury, or repetitive motion. However, often no specific cause is found. SYMPTOMS  Symptoms of benign positional vertigo occur when you move your head or eyes in different directions. Some of the symptoms may include:  Loss of balance and falls.  Vomiting.  Blurred vision.  Dizziness.  Nausea.  Involuntary eye movements (nystagmus). DIAGNOSIS  Benign positional vertigo is usually diagnosed by physical exam. If the specific cause of your benign positional vertigo is unknown, your caregiver may perform imaging tests, such as magnetic resonance imaging (MRI) or computed tomography (CT). TREATMENT  Your caregiver may recommend movements or procedures to correct the benign positional vertigo. Medicines such as meclizine, benzodiazepines, and medicines for nausea may be used to treat your symptoms. In rare cases, if your symptoms are caused by certain conditions that affect the inner ear, you may need surgery. HOME CARE INSTRUCTIONS   Follow your caregiver's instructions.  Move slowly. Do not make sudden body or head movements.  Avoid driving.  Avoid operating heavy machinery.  Avoid performing any tasks that would be dangerous to you or others during a vertigo episode.  Drink enough fluids to keep your urine clear or pale yellow. SEEK IMMEDIATE MEDICAL CARE IF:   You develop problems with walking, weakness, numbness, or using your arms, hands, or legs.  You have difficulty speaking.  You develop  severe headaches.  Your nausea or vomiting continues or gets worse.  You develop visual changes.  Your family or friends notice any behavioral changes.  Your condition gets worse.  You have a fever.  You develop a stiff neck or sensitivity to light. MAKE SURE YOU:   Understand these instructions.  Will watch your condition.  Will get help right away if you are not doing well or get worse. Document Released: 02/08/2006 Document Revised: 07/26/2011 Document Reviewed: 01/21/2011 [employer] Patient Information 2015 [employer]. This information is not intended to replace advice given to you by your health care provider. Make sure you discuss any questions you have with your health care provider.    

## 2014-06-19 NOTE — Progress Notes (Signed)
   Subjective:    Patient ID: Abigail SellsLatoria S Jann, female    DOB: 09-Sep-1980, 34 y.o.   MRN: [id number]  HPI Patient presents with 2 days of dizziness and nausea. Has PMH of vertigo and this feel the same. Feels like room is spinning and is worse when she looks down. Additionally endorses blurry vision and fatigue. Denies HA, double vision, photophobia, weakness, or fever. Drinking plenty of fluids and still able to eat. Ear infection 2 weeks ago that has resolved. No changes in medication regimen. Has taken 25 mg of meclizine which has not helped and zofran that resolved nausea.     Review of Systems  Constitutional: Positive for fatigue. Negative for fever and appetite change.  HENT: Negative.   Eyes: Positive for visual disturbance. Negative for photophobia.  Gastrointestinal: Positive for nausea. Negative for vomiting.  Musculoskeletal: Positive for gait problem (unsteady).  Neurological: Positive for dizziness. Negative for weakness, light-headedness and headaches.       Objective:   Physical Exam  Constitutional: She is oriented to person, place, and time. She appears well-developed and well-nourished. No distress.  Blood pressure 125/83, pulse 94, temperature 98.1 F (36.7 C), resp. rate 18, last menstrual period 06/03/2014, SpO2 98 %.  HENT:  Head: Normocephalic and atraumatic.  Right Ear: External ear normal.  Left Ear: External ear normal.  Mouth/Throat: Oropharynx is clear and moist. No oropharyngeal exudate.  Eyes: Conjunctivae are normal. Pupils are equal, round, and reactive to light. Right eye exhibits no discharge. Left eye exhibits no discharge. No scleral icterus. Right eye exhibits nystagmus (ticks to right; last seconds). Left eye exhibits nystagmus (ticks to right; last seconds).  Neck: Normal range of motion. Neck supple. No thyromegaly present.  Cardiovascular: Normal rate, regular rhythm and normal heart sounds.  Exam reveals no gallop and no friction rub.   No  murmur heard. Pulmonary/Chest: Effort normal and breath sounds normal. No respiratory distress. She has no wheezes. She has no rales.  Musculoskeletal: Normal range of motion.  Lymphadenopathy:    She has no cervical adenopathy.  Neurological: She is alert and oriented to person, place, and time. She has normal reflexes. She displays no atrophy and no tremor. No cranial nerve deficit. She exhibits normal muscle tone. She displays no seizure activity. Gait (unsteady) abnormal. Coordination normal.  Skin: Skin is warm and dry. No rash noted. She is not diaphoretic. No erythema. No pallor.       Assessment & Plan:  1. Vertigo, benign paroxysmal, unspecified laterality Should increase meclizine from 25 mg to 50 mg BID. Continue Zofran prn. Epley's maneuver tried and unsuccessful. Letter to return to work 06/21/14. Advised against driving until symptoms improved.    Janan Ridgeishira Rosary Filosa PA-C  [facility] 06/19/2014 2:06 PM

## 2014-10-30 ENCOUNTER — Ambulatory Visit (INDEPENDENT_AMBULATORY_CARE_PROVIDER_SITE_OTHER): Payer: 59 | Admitting: Family Medicine

## 2014-10-30 VITALS — BP 100/70 | HR 88 | Temp 98.1°F | Ht 64.0 in | Wt 282.0 lb

## 2014-10-30 DIAGNOSIS — S39012A Strain of muscle, fascia and tendon of lower back, initial encounter: Secondary | ICD-10-CM | POA: Diagnosis not present

## 2014-10-30 DIAGNOSIS — G4700 Insomnia, unspecified: Secondary | ICD-10-CM | POA: Diagnosis not present

## 2014-10-30 DIAGNOSIS — G43009 Migraine without aura, not intractable, without status migrainosus: Secondary | ICD-10-CM

## 2014-10-30 DIAGNOSIS — G43909 Migraine, unspecified, not intractable, without status migrainosus: Secondary | ICD-10-CM | POA: Insufficient documentation

## 2014-10-30 MED ORDER — CYCLOBENZAPRINE HCL 10 MG PO TABS: 10.0000 mg | ORAL_TABLET | Freq: Every evening | ORAL | Status: AC | PRN

## 2014-10-30 MED ORDER — ROPINIROLE HCL 2 MG PO TABS: 2.0000 mg | ORAL_TABLET | Freq: Every day | ORAL | Status: DC

## 2014-10-30 NOTE — Progress Notes (Signed)
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 She has tried Ambien without success. She's done in the usual sleep hygiene measures such as turning off her electronics well before going to bed. She thinks that her symptoms of insomnia got worse and she started working her 6 months ago.  Patient also suffers from migraines, perhaps once a week. She has an aura with these. Patient does not want medicines that might make her gain weight.  Objective: Alert, articulate and in no acute distress BP 100/70 mmHg  Pulse 88  Temp(Src) 98.1 F (36.7 C) (Oral)  Ht 5\' 4"  (1.626 m)  Wt 282 lb (127.914 kg)  BMI 48.38 kg/m2  SpO2 98% Neurological exam shows intact cranial nerves, normal gait, normal movement.  Assessment: Patient has a high stress job and often she gets out late. These tend to lead to migraine problems as well as insomnia. I think it's reasonable to try a medicine that would address both of these problems.  This chart was scribed in my presence and reviewed by me personally.    ICD-9-CM ICD-10-CM   1. Low back strain, initial encounter 847.2 S39.012A cyclobenzaprine (FLEXERIL) 10 MG tablet  2. Insomnia 780.52 G47.00 rOPINIRole (REQUIP) 2 MG tablet  3. Migraine without aura and without status migrainosus, not intractable 346.10 G43.009 rOPINIRole (REQUIP) 2 MG tablet   Follow-up one month  Signed, Elvina Sidle, MD

## 2014-10-30 NOTE — Patient Instructions (Signed)
Insomnia Insomnia is frequent trouble falling and/or staying asleep. Insomnia can be a long term problem or a short term problem. Both are common. Insomnia can be a short term problem when the wakefulness is related to a certain stress or worry. Long term insomnia is often related to ongoing stress during waking hours and/or poor sleeping habits. Overtime, sleep deprivation itself can make the problem worse. Every little thing feels more severe because you are overtired and your ability to cope is decreased. CAUSES   Stress, anxiety, and depression.  Poor sleeping habits.  Distractions such as TV in the bedroom.  Naps close to bedtime.  Engaging in emotionally charged conversations before bed.  Technical reading before sleep.  Alcohol and other sedatives. They may make the problem worse. They can hurt normal sleep patterns and normal dream activity.  Stimulants such as caffeine for several hours prior to bedtime.  Pain syndromes and shortness of breath can cause insomnia.  Exercise late at night.  Changing time zones may cause sleeping problems (jet lag). It is sometimes helpful to have someone observe your sleeping patterns. They should look for periods of not breathing during the night (sleep apnea). They should also look to see how long those periods last. If you live alone or observers are uncertain, you can also be observed at a sleep clinic where your sleep patterns will be professionally monitored. Sleep apnea requires a checkup and treatment. Give your caregivers your medical history. Give your caregivers observations your family has made about your sleep.  SYMPTOMS   Not feeling rested in the morning.  Anxiety and restlessness at bedtime.  Difficulty falling and staying asleep. TREATMENT   Your caregiver may prescribe treatment for an underlying medical disorders. Your caregiver can give advice or help if you are using alcohol or other drugs for self-medication. Treatment  of underlying problems will usually eliminate insomnia problems.  Medications can be prescribed for short time use. They are generally not recommended for lengthy use.  Over-the-counter sleep medicines are not recommended for lengthy use. They can be habit forming.  You can promote easier sleeping by making lifestyle changes such as:  Using relaxation techniques that help with breathing and reduce muscle tension.  Exercising earlier in the day.  Changing your diet and the time of your last meal. No night time snacks.  Establish a regular time to go to bed.  Counseling can help with stressful problems and worry.  Soothing music and white noise may be helpful if there are background noises you cannot remove.  Stop tedious detailed work at least one hour before bedtime. HOME CARE INSTRUCTIONS   Keep a diary. Inform your caregiver about your progress. This includes any medication side effects. See your caregiver regularly. Take note of:  Times when you are asleep.  Times when you are awake during the night.  The quality of your sleep.  How you feel the next day. This information will help your caregiver care for you.  Get out of bed if you are still awake after 15 minutes. Read or do some quiet activity. Keep the lights down. Wait until you feel sleepy and go back to bed.  Keep regular sleeping and waking hours. Avoid naps.  Exercise regularly.  Avoid distractions at bedtime. Distractions include watching television or engaging in any intense or detailed activity like attempting to balance the household checkbook.  Develop a bedtime ritual. Keep a familiar routine of bathing, brushing your teeth, climbing into bed at the same   time each night, listening to soothing music. Routines increase the success of falling to sleep faster.  Use relaxation techniques. This can be using breathing and muscle tension release routines. It can also include visualizing peaceful scenes. You can  also help control troubling or intruding thoughts by keeping your mind occupied with boring or repetitive thoughts like the old concept of counting sheep. You can make it more creative like imagining planting one beautiful flower after another in your backyard garden.  During your day, work to eliminate stress. When this is not possible use some of the previous suggestions to help reduce the anxiety that accompanies stressful situations. MAKE SURE YOU:   Understand these instructions.  Will watch your condition.  Will get help right away if you are not doing well or get worse. Document Released: 04/30/2000 Document Revised: 07/26/2011 Document Reviewed: 05/31/2007 [employer] Patient Information 2015 [employer]. This information is not intended to replace advice given to you by your health care provider. Make sure you discuss any questions you have with your health care provider.  

## 2014-10-31 ENCOUNTER — Other Ambulatory Visit: Payer: Self-pay | Admitting: Emergency Medicine

## 2014-10-31 MED ORDER — TEMAZEPAM 15 MG PO CAPS: 15.0000 mg | ORAL_CAPSULE | Freq: Every evening | ORAL | Status: AC | PRN

## 2015-01-15 ENCOUNTER — Ambulatory Visit (INDEPENDENT_AMBULATORY_CARE_PROVIDER_SITE_OTHER): Payer: 59 | Admitting: Family Medicine

## 2015-01-15 VITALS — BP 124/80 | HR 103 | Temp 98.2°F | Resp 17 | Ht 65.0 in | Wt 287.0 lb

## 2015-01-15 DIAGNOSIS — E669 Obesity, unspecified: Secondary | ICD-10-CM | POA: Diagnosis not present

## 2015-01-15 DIAGNOSIS — G479 Sleep disorder, unspecified: Secondary | ICD-10-CM | POA: Diagnosis not present

## 2015-01-15 DIAGNOSIS — R000 Tachycardia, unspecified: Secondary | ICD-10-CM

## 2015-01-15 DIAGNOSIS — G43101 Migraine with aura, not intractable, with status migrainosus: Secondary | ICD-10-CM

## 2015-01-15 LAB — COMPLETE METABOLIC PANEL WITH GFR
ALBUMIN: 3.5 g/dL — AB (ref 3.6–5.1)
ALK PHOS: 92 U/L (ref 33–115)
ALT: 12 U/L (ref 6–29)
AST: 13 U/L (ref 10–30)
BUN: 11 mg/dL (ref 7–25)
CALCIUM: 9.3 mg/dL (ref 8.6–10.2)
CHLORIDE: 106 mmol/L (ref 98–110)
CO2: 27 mmol/L (ref 20–31)
Creat: 0.75 mg/dL (ref 0.50–1.10)
GFR, Est African American: >89 mL/min (ref >=60)
GFR, Est Non African American: >89 mL/min (ref >=60)
Glucose, Bld: 119 mg/dL — ABNORMAL HIGH (ref 65–99)
POTASSIUM: 4.3 mmol/L (ref 3.5–5.3)
Sodium: 140 mmol/L (ref 135–146)
Total Bilirubin: 0.2 mg/dL (ref 0.2–1.2)
Total Protein: 6.5 g/dL (ref 6.1–8.1)

## 2015-01-15 LAB — POCT CBC
GRANULOCYTE PERCENT: 65.2 % (ref 37–80)
HEMATOCRIT: 39.2 % (ref 37.7–47.9)
HEMOGLOBIN: 12.3 g/dL (ref 12.2–16.2)
Lymph, poc: 2.2 (ref 0.6–3.4)
MCH: 23.2 pg — AB (ref 27–31.2)
MCHC: 31.3 g/dL — AB (ref 31.8–35.4)
MCV: 74.1 fL — AB (ref 80–97)
MID (CBC): 0.3 (ref 0–0.9)
MPV: 7.7 fL (ref 0–99.8)
POC GRANULOCYTE: 4.7 (ref 2–6.9)
POC LYMPH PERCENT: 30.8 %L (ref 10–50)
POC MID %: 4 % (ref 0–12)
Platelet Count, POC: 308 10*3/uL (ref 142–424)
RBC: 5.29 M/uL (ref 4.04–5.48)
RDW, POC: 16.3 %
WBC: 7.2 10*3/uL (ref 4.6–10.2)

## 2015-01-15 LAB — TSH: TSH: 1.459 u[IU]/mL (ref 0.350–4.500)

## 2015-01-15 LAB — POCT GLYCOSYLATED HEMOGLOBIN (HGB A1C): Hemoglobin A1C: 6.2

## 2015-01-15 MED ORDER — TIZANIDINE HCL 2 MG PO TABS: ORAL_TABLET | ORAL | Status: AC

## 2015-01-15 MED ORDER — TOPIRAMATE 25 MG PO TABS: ORAL_TABLET | ORAL | Status: AC

## 2015-01-15 MED ORDER — TRAMADOL HCL 50 MG PO TABS: 50.0000 mg | ORAL_TABLET | Freq: Three times a day (TID) | ORAL | Status: AC | PRN

## 2015-01-15 NOTE — Progress Notes (Signed)
Migraine Subjective:  Patient ID: Abigail Paul, female    DOB: February 10, 1981  Age: 34 y.o. MRN: [id number]  34 year old lady who works here at your [employer] he who has a long history of migraines over the last 15 years or so. There was a family history of migraines with her mother having had migraines. She has become increasingly concerned because of increasing frequency of headaches, not quite daily, but probably over 10 times in the last month. She at least some of the time gets a little aura. She has had some chronic intermittent mild vertigo. When she has a bad migraine she has taken some cyclobenzaprine if she has had it , and maybe something like some ibuprofen rest may help the headaches, but she does not get good night sleep always. She has taken some temazepam for sleep. She smokes a black and mild. She has a couple of drinks a week. She has not noticed any pattern with alcohol and tobacco causing the migraines. She has regular menstrual cycles which also have not particularly been associated with the migraines in her mind. She does have her tubes tied so is not on any hormones replacement. She has 2 teenage children. She works long hours as a Engineer, site. No major history of depression. She did have about a 1 year stent of taking Wellbutrin after her grandfather died. She also had seen a headache doctor some years ago and was on Topamax for a period of time, migraines were gone, and she quit taking it.   Objective:   Overweight female, alert and oriented. TMs normal. Eyes PERRLA. EOMs intact. Fundi benign. Throat clear. Neck supple without any nodes or thyromegaly. Chest is clear to auscultation. Heart regular, mildly tachycardic around 100. Abdomen not examined. Extremities also normal. Gait normal with normal heel toe. Romberg negative.  Assessment & Plan:   Assessment:  Migraine, acute and chronic Vertigo Sleep disturbance  Plan:  Try medication for short-term and long-term  control migraines.  Patient Instructions  Take Topamax 25 mg 1 twice daily for 1 week, then increase to 2 twice daily if tolerated  At the onset of migraines go ahead and take ibuprofen 800 mg, then repeat every 8 hours if needed.  If the headache continues to persist take tizanidine 2 mg every 8-12 hours. This may cause you to be drowsy.  If still no relief take tramadol for pain  We'll check baseline labs including CBC, CMP, TSH, A1c  Encourage more regular exercise  Use the Restoril if needed for sleep if you do not feel like you can and on your brain.  Recheck and rediscuss how you're doing in about one month     Christhoper Busbee, MD 01/15/2015

## 2015-01-15 NOTE — Patient Instructions (Signed)
Take Topamax 25 mg 1 twice daily for 1 week, then increase to 2 twice daily if tolerated  At the onset of migraines go ahead and take ibuprofen 800 mg, then repeat every 8 hours if needed.  If the headache continues to persist take tizanidine 2 mg every 8-12 hours. This may cause you to be drowsy.  If still no relief take tramadol for pain  We'll check baseline labs including CBC, CMP, TSH, A1c  Encourage more regular exercise  Use the Restoril if needed for sleep if you do not feel like you can and on your brain.  Recheck and rediscuss how you're doing in about one month

## 2015-01-15 NOTE — Addendum Note (Signed)
Addended by: Jaskirat Zertuche H on: 01/15/2015 08:53 AM   Modules accepted: Orders

## 2015-02-03 ENCOUNTER — Ambulatory Visit (INDEPENDENT_AMBULATORY_CARE_PROVIDER_SITE_OTHER): Payer: 59 | Admitting: Family Medicine

## 2015-02-03 VITALS — BP 132/92 | HR 105 | Temp 98.7°F | Resp 16

## 2015-02-03 DIAGNOSIS — J209 Acute bronchitis, unspecified: Secondary | ICD-10-CM

## 2015-02-03 DIAGNOSIS — R05 Cough: Secondary | ICD-10-CM

## 2015-02-03 DIAGNOSIS — R062 Wheezing: Secondary | ICD-10-CM | POA: Diagnosis not present

## 2015-02-03 DIAGNOSIS — R059 Cough, unspecified: Secondary | ICD-10-CM

## 2015-02-03 MED ORDER — ALBUTEROL SULFATE HFA 108 (90 BASE) MCG/ACT IN AERS: 2.0000 | INHALATION_SPRAY | RESPIRATORY_TRACT | Status: AC | PRN

## 2015-02-03 MED ORDER — HYDROCODONE-HOMATROPINE 5-1.5 MG/5ML PO SYRP: 5.0000 mL | ORAL_SOLUTION | ORAL | Status: DC | PRN

## 2015-02-03 MED ORDER — AZITHROMYCIN 250 MG PO TABS: ORAL_TABLET | ORAL | Status: DC

## 2015-02-03 MED ORDER — ALBUTEROL SULFATE (2.5 MG/3ML) 0.083% IN NEBU: 2.5000 mg | INHALATION_SOLUTION | Freq: Once | RESPIRATORY_TRACT | Status: AC

## 2015-02-03 MED ORDER — BENZONATATE 100 MG PO CAPS: 100.0000 mg | ORAL_CAPSULE | Freq: Three times a day (TID) | ORAL | Status: DC | PRN

## 2015-02-03 NOTE — Progress Notes (Signed)
Patient ID: Abigail Paul, female    DOB: [birth year]  Age: 34 y.o. MRN: [id number]  Chief Complaint  Patient presents with  . Cough    x friday  . Chest Pain    x friday    Subjective:    34 year old lady well-known to me. Her headaches from a few weeks ago negative done well. She got sick Friday with a respiratory tract infection. She's been coughing and developing more wheezing. She feels short of breath and tight in the chest. She coughed so hard that she vomits up some yellow phlegm. She smokes about 1 black and mild week , has not smoked last few days. She is not on any regular breathing medicine but she does have a history of having had some wheezing in the past. There is a family history of asthma in her mother and child. She has not been febrile. Which coughs a lot she blows some but does not really have much upper respiratory congestion.  Current allergies, medications, problem list, past/family and social histories reviewed.  Objective:  BP 132/92 mmHg  Pulse 105  Temp(Src) 98.7 F (37.1 C) (Oral)  Resp 16  SpO2 98%  PF 350 L/min  LMP 01/10/2015   has coughing spasms. Did so in the exam room until she gags. Her TMs are normal. Throat clear. Neck supple without significant nodes. She has a large lower anterior neck, and I question whether she could be developing a quarter but I don't believe it is thyroid I'm feeling, probably just soft tissues. Her chest was clear except for couple scattered wheezes and rhonchi. Heart regular without murmurs.    peak flow was 350 with a predicted of about 410.  Assessment & Plan:   Assessment: 1. Acute bronchitis, unspecified organism   2. Wheeze   3. Cough       Plan:   Meds ordered this encounter  Medications  . albuterol (PROVENTIL) (2.5 MG/3ML) 0.083% nebulizer solution 2.5 mg    Sig:   . azithromycin (ZITHROMAX) 250 MG tablet    Sig: Take 2 initially, then 1 daily for 4 days    Dispense:  6 tablet    Refill:  0  .  albuterol (PROVENTIL HFA;VENTOLIN HFA) 108 (90 BASE) MCG/ACT inhaler    Sig: Inhale 2 puffs into the lungs every 4 (four) hours as needed for wheezing or shortness of breath (cough, shortness of breath or wheezing.).    Dispense:  1 Inhaler    Refill:  1  . benzonatate (TESSALON) 100 MG capsule    Sig: Take 1-2 capsules (100-200 mg total) by mouth 3 (three) times daily as needed.    Dispense:  30 capsule    Refill:  0  . HYDROcodone-homatropine (HYCODAN) 5-1.5 MG/5ML syrup    Sig: Take 5 mLs by mouth every 4 (four) hours as needed.    Dispense:  120 mL    Refill:  0      we'll see how she responds to the breathing treatment    There are no Patient Instructions on file for this visit.   No Follow-up on file.   HOPPER,DAVID, MD 02/03/2015

## 2015-02-03 NOTE — Patient Instructions (Signed)
Take Tessalon for cough 102 pills 3 times daily  Take Hycodan if needed for severe cough (this is sedating. )   take azithromycin 2 pills initially, then 1 daily for 4 days  Take albuterol inhaler 2 inhalations every 4-6 hours  Return if worse at anytime

## 2015-02-12 ENCOUNTER — Ambulatory Visit (INDEPENDENT_AMBULATORY_CARE_PROVIDER_SITE_OTHER): Payer: 59 | Admitting: Emergency Medicine

## 2015-02-12 VITALS — BP 136/89 | HR 103 | Temp 97.8°F | Resp 18 | Ht 65.0 in | Wt 280.0 lb

## 2015-02-12 DIAGNOSIS — Z113 Encounter for screening for infections with a predominantly sexual mode of transmission: Secondary | ICD-10-CM

## 2015-02-12 DIAGNOSIS — N926 Irregular menstruation, unspecified: Secondary | ICD-10-CM | POA: Diagnosis not present

## 2015-02-12 LAB — HIV ANTIBODY (ROUTINE TESTING W REFLEX): HIV 1&2 Ab, 4th Generation: NONREACTIVE

## 2015-02-12 LAB — RPR

## 2015-02-12 LAB — POCT CBC
Granulocyte percent: 64.2 %G (ref 37–80)
HEMATOCRIT: 39.7 % (ref 37.7–47.9)
Hemoglobin: 12.3 g/dL (ref 12.2–16.2)
LYMPH, POC: 2.5 (ref 0.6–3.4)
MCH: 23.1 pg — AB (ref 27–31.2)
MCHC: 31 g/dL — AB (ref 31.8–35.4)
MCV: 74.6 fL — AB (ref 80–97)
MID (CBC): 0.4 (ref 0–0.9)
MPV: 8.2 fL (ref 0–99.8)
POC GRANULOCYTE: 5.2 (ref 2–6.9)
POC LYMPH PERCENT: 30.4 %L (ref 10–50)
POC MID %: 5.4 % (ref 0–12)
Platelet Count, POC: 261 10*3/uL (ref 142–424)
RBC: 5.32 M/uL (ref 4.04–5.48)
RDW, POC: 16.6 %
WBC: 8.1 10*3/uL (ref 4.6–10.2)

## 2015-02-12 LAB — POCT URINE PREGNANCY: PREG TEST UR: NEGATIVE

## 2015-02-12 NOTE — Progress Notes (Signed)
This chart was scribed for Collene Gobble, MD by Charline Bills, ED Scribe. The patient was seen in room 11. Patient's care was started at 8:23 AM.  Chief Complaint:  Chief Complaint  Patient presents with  . Irregular cycles    HPI: Abigail Paul is a 34 y.o. female, with a h/o tubal ligation, who reports to [facility] today complaining of gradually worsening irregular menstrual cycles over the past few years. Pt states that she initially started having irregular periods in 2004 following her tubal ligation. She states that her menstrual cycle used to come every 28 days, however, they have gotten further apart. She further reports worsening abdominal cramps and having her period for 3-4 days some months and 7-10 days other months. Pt states that her flow varies from heavy and light each month. She denies vaginal bleeding or abdominal pain at this time. She states that she tried Depo for 1 year 2 years ago to regulate her period. She requests to start depo again for regulation. Pt's last pelvic exam was last year at [facility] by Dr. Shanon Payor. Pt's mother had an early hysterectomy due to fibroids so she is not sure about her h/o menopause. Pt has 2 children, ages 33 and 32.    No past medical history on file. Past Surgical History  Procedure Laterality Date  . Tubal ligation    . Tonsillectomy     Social History   Social History  . Marital Status: Divorced    Spouse Name: N/A  . Number of Children: N/A  . Years of Education: N/A   Social History Main Topics  . Smoking status: Current Some Day Smoker -- 1.00 packs/day for 10 years    Types: Cigars, Cigarettes  . Smokeless tobacco: None  . Alcohol Use: 1.8 oz/week    3 Shots of liquor per week  . Drug Use: No  . Sexual Activity: Not Asked   Other Topics Concern  . None   Social History Narrative   Family History  Problem Relation Age of Onset  . Asthma Mother   . Asthma Daughter    Allergies  Allergen Reactions  .  Amoxicillin Shortness Of Breath  . Penicillins Shortness Of Breath   Prior to Admission medications   Medication Sig Start Date End Date Taking? Authorizing Aaryn Parrilla  albuterol (PROVENTIL HFA;VENTOLIN HFA) 108 (90 BASE) MCG/ACT inhaler Inhale 2 puffs into the lungs every 4 (four) hours as needed for wheezing or shortness of breath (cough, shortness of breath or wheezing.). 02/03/15  Yes Peyton Najjar, MD  cyclobenzaprine (FLEXERIL) 10 MG tablet Take 1 tablet (10 mg total) by mouth at bedtime and may repeat dose one time if needed. 10/30/14  Yes Elvina Sidle, MD  temazepam (RESTORIL) 15 MG capsule Take 1 capsule (15 mg total) by mouth at bedtime as needed for sleep. 10/31/14  Yes Carmelina Dane, MD  tiZANidine (ZANAFLEX) 2 MG tablet Take one half to one tablet every 8 hours if needed for migraine 01/15/15  Yes Peyton Najjar, MD  topiramate (TOPAMAX) 25 MG tablet Take 1 tablet twice daily for 1 week, then increase to 2 twice daily if tolerated 01/15/15  Yes Peyton Najjar, MD  traMADol (ULTRAM) 50 MG tablet Take 1 tablet (50 mg total) by mouth every 8 (eight) hours as needed. 01/15/15  Yes Peyton Najjar, MD     ROS: The patient denies fevers, chills, night sweats, unintentional weight loss, chest pain, palpitations, wheezing,  dyspnea on exertion, nausea, vomiting, dysuria, hematuria, melena, numbness, weakness, or tingling. + abdominal pain (resolved), + vaginal bleeding (resolved)  All other systems have been reviewed and were otherwise negative with the exception of those mentioned in the HPI and as above.    PHYSICAL EXAM: Filed Vitals:   02/12/15 0815  BP: 136/89  Pulse: 103  Temp: 97.8 F (36.6 C)  Resp: 18   Body mass index is 46.59 kg/(m^2).   General: Alert, no acute distress HEENT:  Normocephalic, atraumatic, oropharynx patent. Eye: Nonie Hoyer [facility] Cardiovascular:  Regular rate and rhythm, no rubs murmurs or gallops.  No Carotid bruits, radial pulse intact. No pedal edema.    Respiratory: Clear to auscultation bilaterally.  No wheezes, rales, or rhonchi.  No cyanosis, no use of accessory musculature Abdominal: No organomegaly, abdomen is soft and non-tender, positive bowel sounds.  No masses. Musculoskeletal: Gait intact. No edema, tenderness Skin: No rashes. Neurologic: Facial musculature symmetric. Psychiatric: Patient acts appropriately throughout our interaction. Lymphatic: No cervical or submandibular lymphadenopathy  LABS:   EKG/XRAY:   Primary read interpreted by Dr. Cleta Alberts at [facility].   ASSESSMENT/PLAN: By signing my name below, I, Essence Howell, attest that this documentation has been prepared under the direction and in the presence of Collene Gobble, MD. Electronically Signed: Charline Bills, Medical Scribe 02/12/2015 at 8:23 AM. Routine labs were done as well as Pap smear. Once I had this back I will discuss with GYN whether it would be appropriate to give her Depo-Provera every 3 months to help with her menstrual irregularity and discomfort.I personally performed the services described in this documentation, which was scribed in my presence. The recorded information has been reviewed and is accurate.    Gross sideeffects, risk and benefits, and alternatives of medications d/w patient. Patient is aware that all medications have potential sideeffects and we are unable to predict every sideeffect or drug-drug interaction that may occur.  Lesle Chris MD 02/12/2015 8:23 AM

## 2015-02-13 ENCOUNTER — Telehealth: Payer: Self-pay

## 2015-02-13 NOTE — Telephone Encounter (Signed)
That will be fine. 

## 2015-02-13 NOTE — Telephone Encounter (Signed)
[facility] called regarding estradiol lab. They said you ordered estradiol free which requires a red top; they did not receive a red top. They called to see if we would be willing to have a plain estradiol run off a SST. I gave the okay to run what they could off the SST. Is this okay with you?

## 2015-02-14 LAB — FSH/LH
FSH: 5.1 m[IU]/mL
LH: 6.4 m[IU]/mL

## 2015-02-14 LAB — PAP IG, CT-NG NAA, HPV HIGH-RISK
Chlamydia Probe Amp: NEGATIVE
GC Probe Amp: NEGATIVE
HPV DNA High Risk: NOT DETECTED

## 2015-02-14 LAB — ESTRADIOL: ESTRADIOL: 53.6 pg/mL

## 2015-02-18 ENCOUNTER — Other Ambulatory Visit: Payer: Self-pay | Admitting: Physician Assistant

## 2015-02-18 DIAGNOSIS — R87611 Atypical squamous cells cannot exclude high grade squamous intraepithelial lesion on cytologic smear of cervix (ASC-H): Secondary | ICD-10-CM

## 2015-02-28 ENCOUNTER — Ambulatory Visit: Payer: 59 | Admitting: Gynecology

## 2015-03-26 ENCOUNTER — Ambulatory Visit: Payer: 59 | Admitting: Gynecology

## 2015-04-04 ENCOUNTER — Ambulatory Visit (INDEPENDENT_AMBULATORY_CARE_PROVIDER_SITE_OTHER): Payer: 59 | Admitting: Emergency Medicine

## 2015-04-04 VITALS — BP 128/86 | HR 106 | Temp 98.4°F | Resp 17 | Ht 64.0 in | Wt 282.0 lb

## 2015-04-04 DIAGNOSIS — J0140 Acute pansinusitis, unspecified: Secondary | ICD-10-CM

## 2015-04-04 DIAGNOSIS — J209 Acute bronchitis, unspecified: Secondary | ICD-10-CM | POA: Diagnosis not present

## 2015-04-04 MED ORDER — PSEUDOEPHEDRINE-GUAIFENESIN ER 60-600 MG PO TB12: 1.0000 | ORAL_TABLET | Freq: Two times a day (BID) | ORAL | Status: AC

## 2015-04-04 MED ORDER — LEVOFLOXACIN 500 MG PO TABS
500.0000 mg | ORAL_TABLET | Freq: Every day | ORAL | Status: AC
Start: 2015-04-04 — End: 2015-04-14

## 2015-04-04 MED ORDER — HYDROCOD POLST-CPM POLST ER 10-8 MG/5ML PO SUER: 5.0000 mL | Freq: Two times a day (BID) | ORAL | Status: AC

## 2015-04-04 NOTE — Progress Notes (Signed)
Subjective:  Patient ID: Abigail Paul, female    DOB: May 03, 1981  Age: 34 y.o. MRN: [id number]  CC: Cough and Nasal Congestion   HPI Abigail Paul presents  with nasal congestion postnasal drainage. She has green nasal discharge. She has a cough productive of green sputum. She has occasional wheezing. No shortness of breath. No fever or chills. No nausea or vomiting. Stool change. No rash. Said no improvement with over-the-counter medication. She has some exertional shortness of breath  History Abigail Paul has no past medical history on file.   She has past surgical history that includes Tubal ligation and Tonsillectomy.   Her  family history includes Asthma in her daughter and mother.  She   reports that she has been smoking Cigars and Cigarettes.  She has a 10 pack-year smoking history. She does not have any smokeless tobacco history on file. She reports that she drinks about 1.8 oz of alcohol per week. She reports that she does not use illicit drugs.  Outpatient Prescriptions Prior to Visit  Medication Sig Dispense Refill  . albuterol (PROVENTIL HFA;VENTOLIN HFA) 108 (90 BASE) MCG/ACT inhaler Inhale 2 puffs into the lungs every 4 (four) hours as needed for wheezing or shortness of breath (cough, shortness of breath or wheezing.). 1 Inhaler 1  . cyclobenzaprine (FLEXERIL) 10 MG tablet Take 1 tablet (10 mg total) by mouth at bedtime and may repeat dose one time if needed. 30 tablet 3  . temazepam (RESTORIL) 15 MG capsule Take 1 capsule (15 mg total) by mouth at bedtime as needed for sleep. 30 capsule 2  . tiZANidine (ZANAFLEX) 2 MG tablet Take one half to one tablet every 8 hours if needed for migraine 30 tablet 1  . topiramate (TOPAMAX) 25 MG tablet Take 1 tablet twice daily for 1 week, then increase to 2 twice daily if tolerated 60 tablet 1  . traMADol (ULTRAM) 50 MG tablet Take 1 tablet (50 mg total) by mouth every 8 (eight) hours as needed. 20 tablet 0   Facility-Administered  Medications Prior to Visit  Medication Dose Route Frequency Provider Last Rate Last Dose  . albuterol (PROVENTIL) (2.5 MG/3ML) 0.083% nebulizer solution 2.5 mg  2.5 mg Nebulization Once Peyton Najjaravid H Hopper, MD        Social History   Social History  . Marital Status: Divorced    Spouse Name: N/A  . Number of Children: N/A  . Years of Education: N/A   Social History Main Topics  . Smoking status: Current Some Day Smoker -- 1.00 packs/day for 10 years    Types: Cigars, Cigarettes  . Smokeless tobacco: None  . Alcohol Use: 1.8 oz/week    3 Shots of liquor per week  . Drug Use: No  . Sexual Activity: Not Asked   Other Topics Concern  . None   Social History Narrative     Review of Systems  Constitutional: Negative for fever, chills and appetite change.  HENT: Positive for congestion, postnasal drip, rhinorrhea and sinus pressure. Negative for ear pain and sore throat.   Eyes: Negative for pain and redness.  Respiratory: Positive for cough. Negative for shortness of breath and wheezing.   Cardiovascular: Negative for leg swelling.  Gastrointestinal: Negative for nausea, vomiting, abdominal pain, diarrhea, constipation and blood in stool.  Endocrine: Negative for polyuria.  Genitourinary: Negative for dysuria, urgency, frequency and flank pain.  Musculoskeletal: Negative for gait problem.  Skin: Negative for rash.  Neurological: Negative for weakness and headaches.  Psychiatric/Behavioral: Negative for confusion and decreased concentration. The patient is not nervous/anxious.     Objective:  BP 128/86 mmHg  Pulse 106  Temp(Src) 98.4 F (36.9 C) (Oral)  Resp 17  Ht  (1.626 m)  Wt 282 lb (127.914 kg)  BMI 48.38 kg/m2  SpO2 99%  LMP 03/27/2015  Physical Exam  Constitutional: She is oriented to person, place, and time. She appears well-developed and well-nourished. No distress.  HENT:  Head: Normocephalic and atraumatic.  Right Ear: External ear normal.  Left Ear:  External ear normal.  Nose: Nose normal.  Eyes: Conjunctivae and EOM are normal. Pupils are equal, round, and reactive to light. No scleral icterus.  Neck: Normal range of motion. Neck supple. No tracheal deviation present.  Cardiovascular: Normal rate, regular rhythm and normal heart sounds.   Pulmonary/Chest: Effort normal. No respiratory distress. She has no wheezes. She has no rales.  Abdominal: She exhibits no mass. There is no tenderness. There is no rebound and no guarding.  Musculoskeletal: She exhibits no edema.  Lymphadenopathy:    She has no cervical adenopathy.  Neurological: She is alert and oriented to person, place, and time. Coordination normal.  Skin: Skin is warm and dry. No rash noted.  Psychiatric: She has a normal mood and affect. Her behavior is normal.      Assessment & Plan:   Abigail Paul was seen today for cough and nasal congestion.  Diagnoses and all orders for this visit:  Acute bronchitis, unspecified organism  Acute pansinusitis, recurrence not specified  Other orders -     levofloxacin (LEVAQUIN) 500 MG tablet; Take 1 tablet (500 mg total) by mouth daily. -     pseudoephedrine-guaifenesin (MUCINEX D) 60-600 MG 12 hr tablet; Take 1 tablet by mouth every 12 (twelve) hours. -     chlorpheniramine-HYDROcodone (TUSSIONEX PENNKINETIC ER) 10-8 MG/5ML SUER; Take 5 mLs by mouth 2 (two) times daily.   I am having Abigail Paul start on levofloxacin, pseudoephedrine-guaifenesin, and chlorpheniramine-HYDROcodone. I am also having her maintain her cyclobenzaprine, temazepam, tiZANidine, topiramate, traMADol, and albuterol. We will continue to administer albuterol.  Meds ordered this encounter  Medications  . levofloxacin (LEVAQUIN) 500 MG tablet    Sig: Take 1 tablet (500 mg total) by mouth daily.    Dispense:  10 tablet    Refill:  0  . pseudoephedrine-guaifenesin (MUCINEX D) 60-600 MG 12 hr tablet    Sig: Take 1 tablet by mouth every 12 (twelve) hours.     Dispense:  18 tablet    Refill:  0  . chlorpheniramine-HYDROcodone (TUSSIONEX PENNKINETIC ER) 10-8 MG/5ML SUER    Sig: Take 5 mLs by mouth 2 (two) times daily.    Dispense:  60 mL    Refill:  0    Appropriate red flag conditions were discussed with the patient as well as actions that should be taken.  Patient expressed his understanding.  Follow-up: Return if symptoms worsen or fail to improve.  Carmelina Dane, MD

## 2015-04-04 NOTE — Patient Instructions (Signed)

## 2015-05-19 IMAGING — CR DG CHEST 2V
3 series · 3 of 3 positions shown · non-contrast
Comparison: PA and lateral chest x-ray November 29, 2012

CLINICAL DATA: Chest pain

EXAM:
CHEST  2 VIEW with repeat PA view with better inspiration

[PA (1 of 2)]
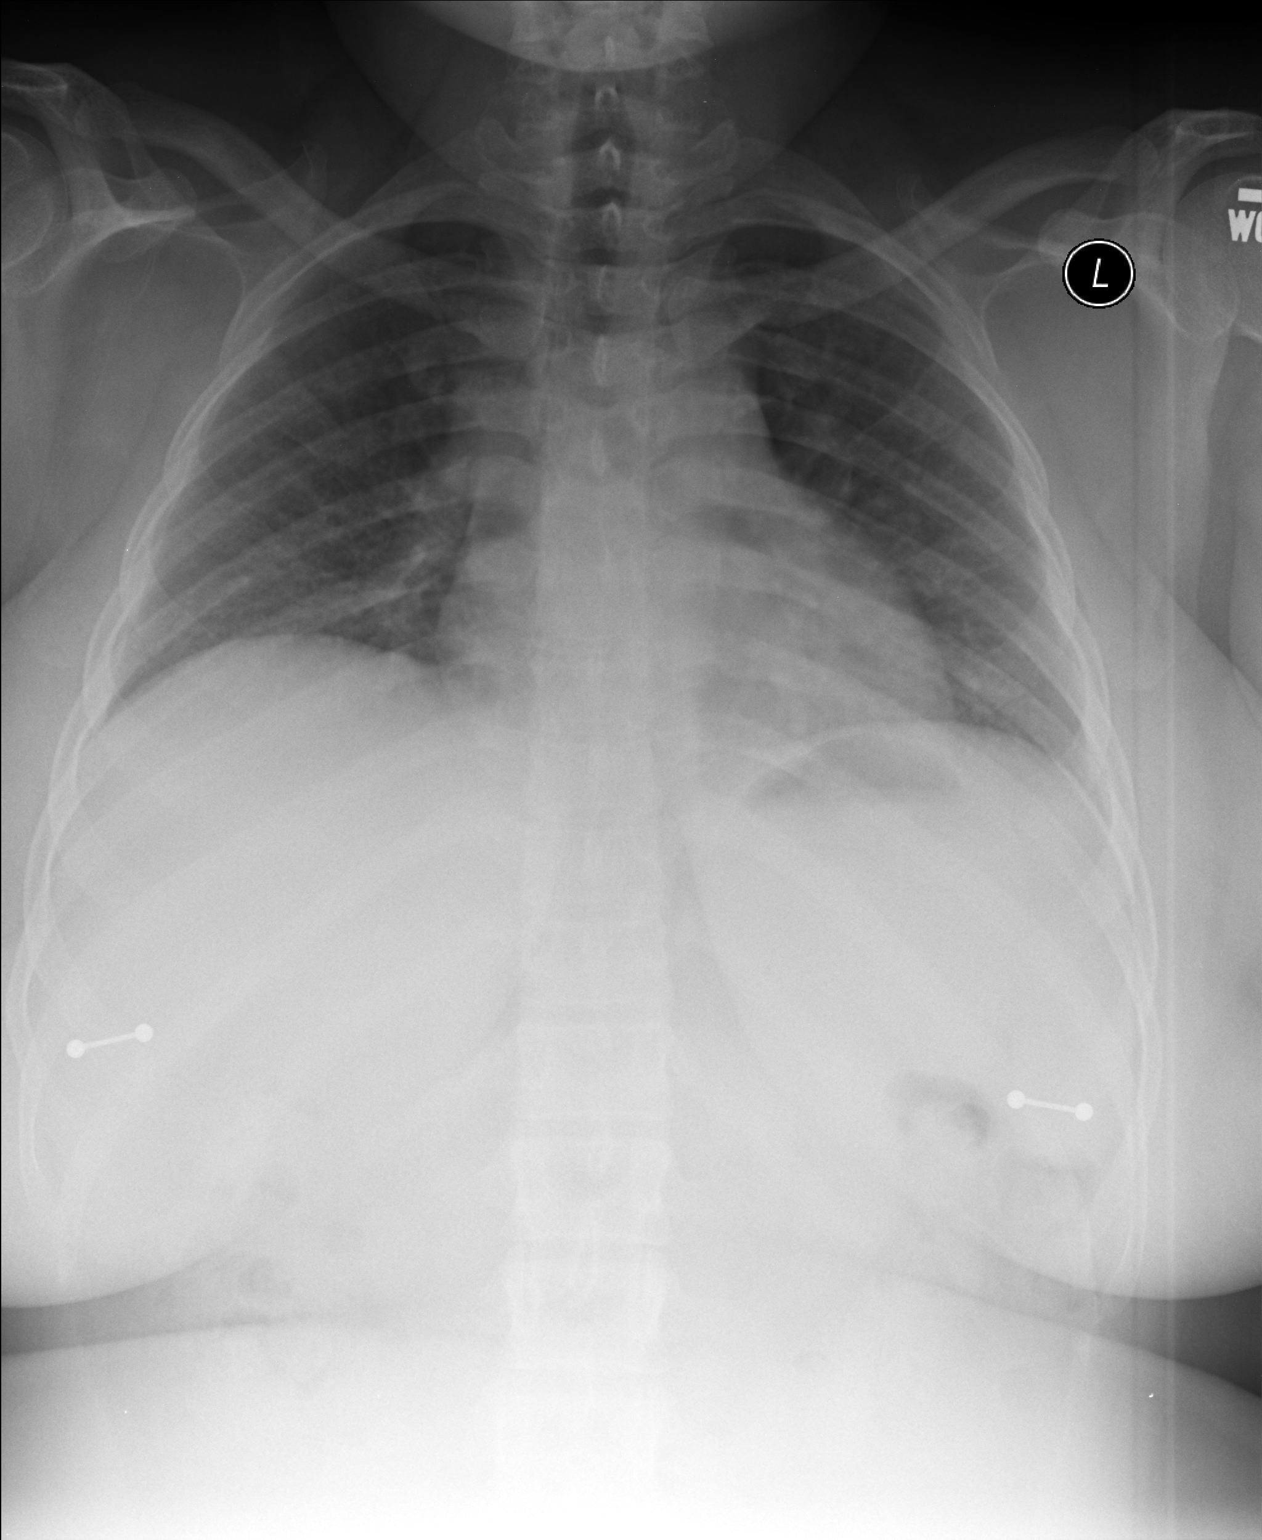

[lateral]
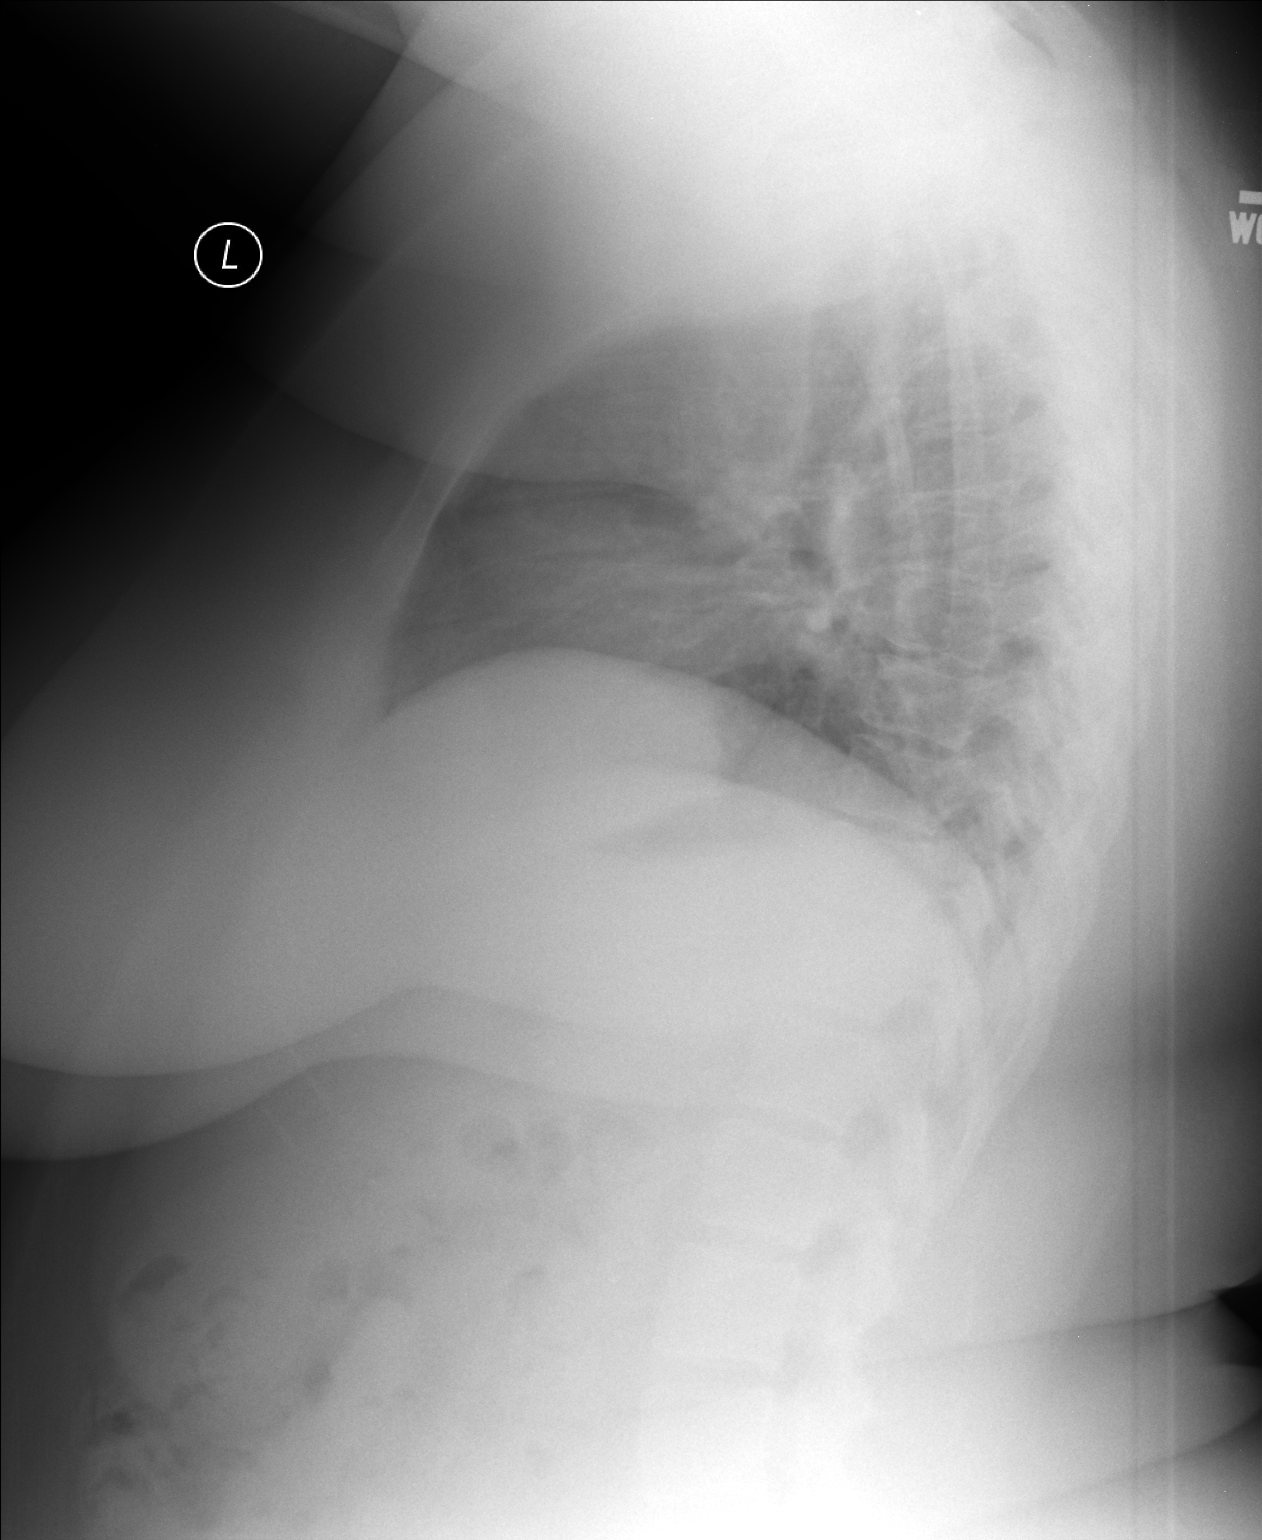

[PA (2 of 2)]
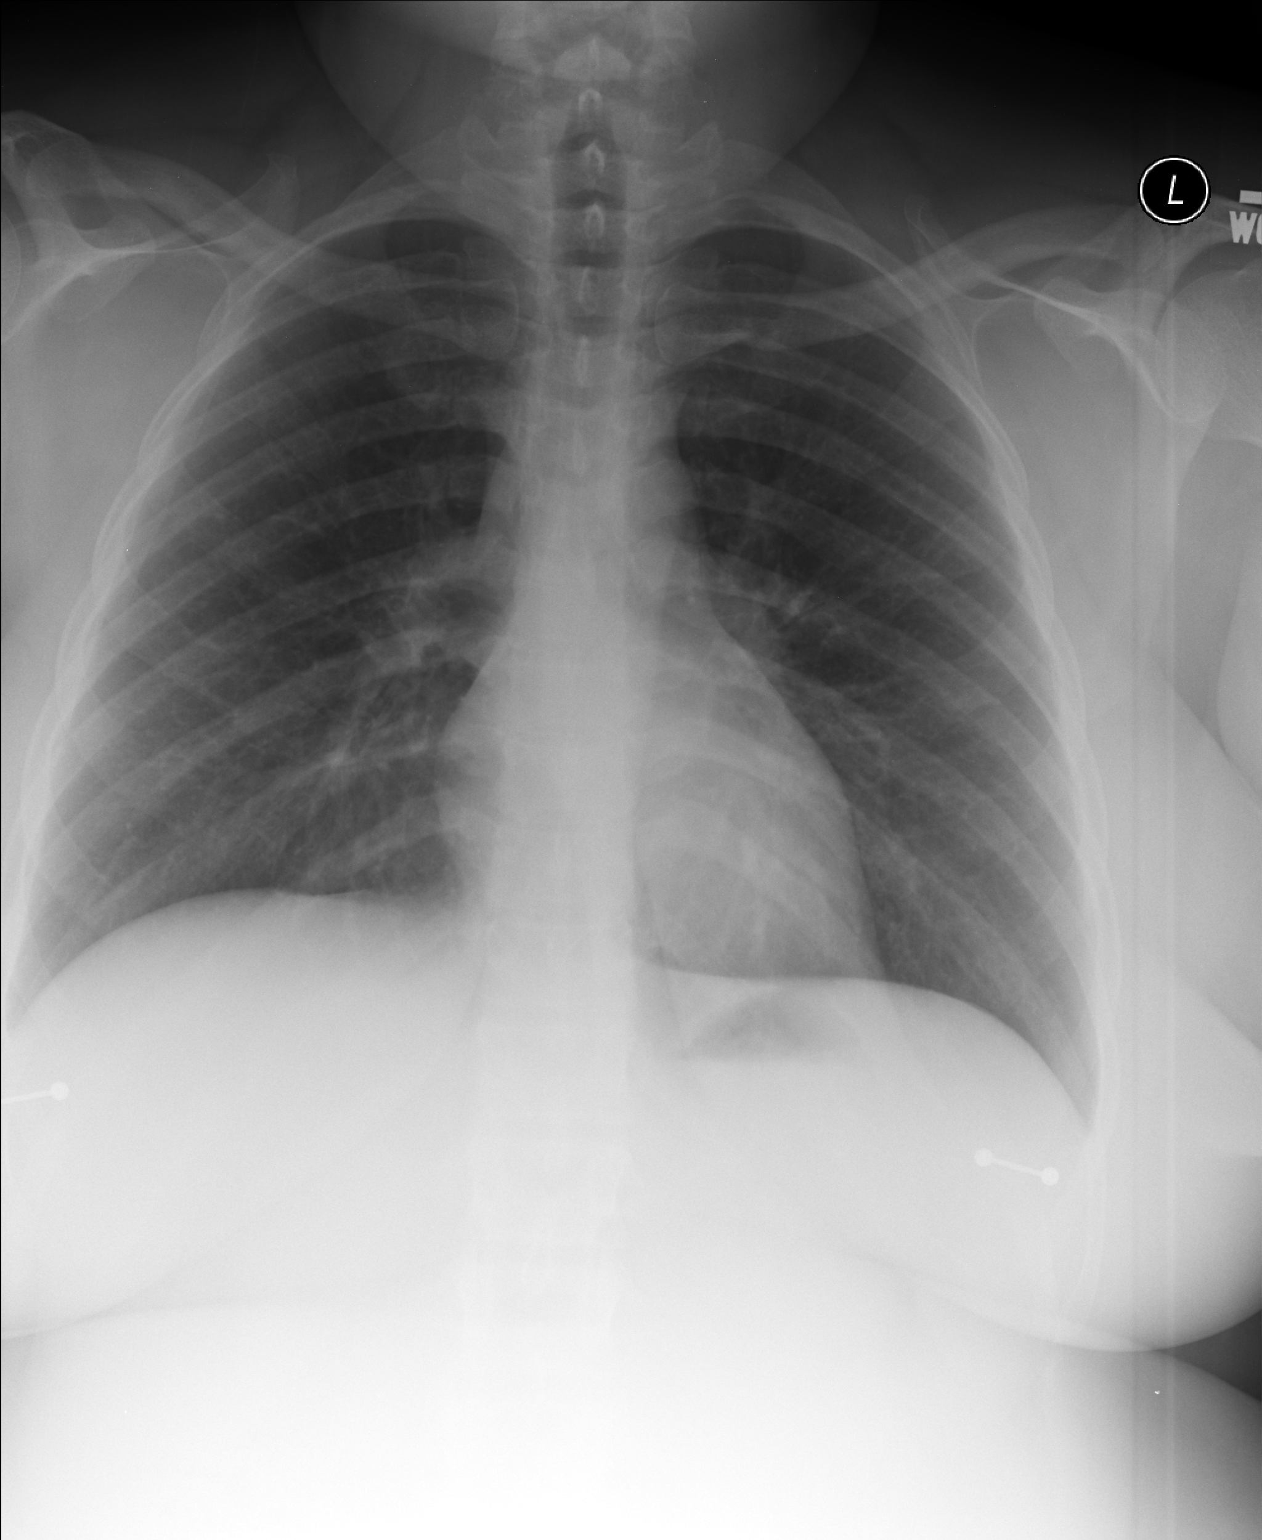

[3 of 3 positions shown; findings below may reference images not displayed]

FINDINGS: The lungs are well-expanded and clear. The heart and mediastinal
structures are normal. There is no pleural effusion or pneumothorax.
The bony thorax is unremarkable.
IMPRESSION: There is no active cardiopulmonary disease.

## 2015-05-29 ENCOUNTER — Ambulatory Visit (INDEPENDENT_AMBULATORY_CARE_PROVIDER_SITE_OTHER): Payer: 59 | Admitting: Emergency Medicine

## 2015-05-29 DIAGNOSIS — Z111 Encounter for screening for respiratory tuberculosis: Secondary | ICD-10-CM

## 2015-05-29 NOTE — Progress Notes (Signed)
Subjective:  Patient ID: Abigail Paul, female    DOB: [birth year]  Age: 35 y.o. MRN: [id number]  CC: No chief complaint on file.   HPI Abigail Paul presents    She has come in for a TB skin test she has no acute complaints  History Abigail Paul has no past medical history on file.   She has past surgical history that includes Tubal ligation and Tonsillectomy.   Her  family history includes Asthma in her daughter and mother.  She   reports that she has been smoking Cigars and Cigarettes.  She has a 10 pack-year smoking history. She does not have any smokeless tobacco history on file. She reports that she drinks about 1.8 oz of alcohol per week. She reports that she does not use illicit drugs.  Outpatient Prescriptions Prior to Visit  Medication Sig Dispense Refill  . albuterol (PROVENTIL HFA;VENTOLIN HFA) 108 (90 BASE) MCG/ACT inhaler Inhale 2 puffs into the lungs every 4 (four) hours as needed for wheezing or shortness of breath (cough, shortness of breath or wheezing.). 1 Inhaler 1  . chlorpheniramine-HYDROcodone (TUSSIONEX PENNKINETIC ER) 10-8 MG/5ML SUER Take 5 mLs by mouth 2 (two) times daily. 60 mL 0  . cyclobenzaprine (FLEXERIL) 10 MG tablet Take 1 tablet (10 mg total) by mouth at bedtime and may repeat dose one time if needed. 30 tablet 3  . pseudoephedrine-guaifenesin (MUCINEX D) 60-600 MG 12 hr tablet Take 1 tablet by mouth every 12 (twelve) hours. 18 tablet 0  . temazepam (RESTORIL) 15 MG capsule Take 1 capsule (15 mg total) by mouth at bedtime as needed for sleep. 30 capsule 2  . tiZANidine (ZANAFLEX) 2 MG tablet Take one half to one tablet every 8 hours if needed for migraine 30 tablet 1  . topiramate (TOPAMAX) 25 MG tablet Take 1 tablet twice daily for 1 week, then increase to 2 twice daily if tolerated 60 tablet 1  . traMADol (ULTRAM) 50 MG tablet Take 1 tablet (50 mg total) by mouth every 8 (eight) hours as needed. 20 tablet 0   Facility-Administered Medications  Prior to Visit  Medication Dose Route Frequency Provider Last Rate Last Dose  . albuterol (PROVENTIL) (2.5 MG/3ML) 0.083% nebulizer solution 2.5 mg  2.5 mg Nebulization Once Peyton Najjar, MD        Social History   Social History  . Marital Status: Divorced    Spouse Name: N/A  . Number of Children: N/A  . Years of Education: N/A   Social History Main Topics  . Smoking status: Current Some Day Smoker -- 1.00 packs/day for 10 years    Types: Cigars, Cigarettes  . Smokeless tobacco: Not on file  . Alcohol Use: 1.8 oz/week    3 Shots of liquor per week  . Drug Use: No  . Sexual Activity: Not on file   Other Topics Concern  . Not on file   Social History Narrative     Review of Systems    noncontributory  Objective:  There were no vitals taken for this visit.  Physical Exam  Constitutional: She is oriented to person, place, and time. She appears well-developed and well-nourished.  HENT:  Head: Normocephalic and atraumatic.  Eyes: Conjunctivae are normal. Pupils are equal, round, and reactive to light.  Pulmonary/Chest: Effort normal.  Musculoskeletal: She exhibits no edema.  Neurological: She is alert and oriented to person, place, and time.  Skin: Skin is dry.  Psychiatric: She has a normal mood and  affect. Her behavior is normal. Thought content normal.      Assessment & Plan:   Diagnoses and all orders for this visit:  Encounter for PPD test -     TB Skin Test   I am having Abigail Paul maintain her cyclobenzaprine, temazepam, tiZANidine, topiramate, traMADol, albuterol, pseudoephedrine-guaifenesin, and chlorpheniramine-HYDROcodone. We will continue to administer albuterol.  No orders of the defined types were placed in this encounter.    Appropriate red flag conditions were discussed with the patient as well as actions that should be taken.  Patient expressed his understanding.  Follow-up: No Follow-up on file.  Carmelina DaneAnderson, Toniette Devera S, MD

## 2015-05-29 NOTE — Progress Notes (Signed)
  Tuberculosis Risk Questionnaire  1. No Were you born outside the [place] in one of the following parts of the world: [place], [place], [place], [place] or [place]?    2. No Have you traveled outside the [place] and lived for more than one month in one of the following parts of the world: [place], [place], [place], [place] or [place]?    3. No Do you have a compromised immune system such as from any of the following conditions:HIV/AIDS, organ or bone marrow transplantation, diabetes, immunosuppressive medicines (e.g. Prednisone, Remicaide), leukemia, lymphoma, cancer of the head or neck, gastrectomy or jejunal bypass, end-stage renal disease (on dialysis), or silicosis?     4. Yes  Have you ever or do you plan on working in: a residential care center, a health care facility, a jail or prison or homeless shelter?    5. No Have you ever: injected illegal drugs, used crack cocaine, lived in a homeless shelter  or been in jail or prison?     6. Yes  Have you ever been exposed to anyone with infectious tuberculosis?    Tuberculosis Symptom Questionnaire  Do you currently have any of the following symptoms?  1. No Unexplained cough lasting more than 3 weeks?   2. No Unexplained fever lasting more than 3 weeks.   3. No Night Sweats (sweating that leaves the bedclothes and sheets wet)     4. No Shortness of Breath   5. No Chest Pain   6. No Unintentional weight loss    7. No Unexplained fatigue (very tired for no reason)   

## 2015-05-29 NOTE — Patient Instructions (Signed)
Tuberculin Skin Test WHY AM I HAVING THIS TEST? Tuberculosis (TB) is a bacterial infection caused by Mycobacterium tuberculosis. Most people who are exposed to these bacteria have a strong enough defense (immune) system to prevent the bacteria from causing TB and developing symptoms. Their bodies prevent the germs from being active and making them sick (latent TB infection).  However, if you have TB germs in your body and your immune system is weak, you can develop a TB infection. This can cause symptoms such as:   Night sweats.  Fever.  Weakness.  Weight loss. A latent TB infection can also become active later in life if your immune system becomes weakened or compromised. You may have this test if your health care provider suspects that you have TB. You may also have this test to screen for TB if you are at risk for getting the disease. Those at increased risk include:  People who inject illegal drugs or share needles.  People with HIV or other diseases that affect immunity.  Health care workers.  People who live in high-risk communities, such as homeless shelters, nursing homes, and correctional facilities.  People who have been in contact with someone with TB.  People from countries where TB is more common. If you are in a high-risk group, your health care provider may wish to screen for TB more often. This can help prevent the spread of the disease. Sometimes TB screening is required when starting a new job, such as becoming a health care worker or a teacher. Colleges or universities may require it of new students. HOW WILL I BE TESTED? A tuberculin skin test is the main test used to check for exposure to the bacteria that can cause TB. The test checks for antibodies to the bacteria. Antibodies are proteins that your body produces to protect you from germs and other things that can make you sick. Your health care provider will inject a solution known as PPD (purified protein  derivative) under the first layer of skin on your arm. This causes a blister-like bubble to form at the site. Your health care provider will then examine the site after a number of hours have passed to see if a reaction has occurred. HOW DO I PREPARE FOR THE TEST? There is no preparation required for this test. WHAT DO THE RESULTS MEAN? Your test results will be reported as either negative or positive.  If the tuberculin skin test produces a negative result, it is likely that you do not have TB and have not been exposed to the TB bacteria. If you or your health care provider suspects exposure, however, you may want to repeat the test a few weeks later. A blood test may also be used to check for TB. This is because you will not react to the tuberculin skin test until several weeks after exposure to TB bacteria. If you test positive to the tuberculin skin test, it is likely that you have been exposed to TB bacteria. The test does not distinguish between an active and a latent TB infection. A false-positive result can occur. A false-positive result for TB bacteria is incorrect because it indicates a condition or finding is present when it is not. Talk to your health care provider to discuss your results, treatment options, and if necessary, the need for more tests. It is your responsibility to obtain your test results. Ask the lab or department performing the test when and how you will get your results. Talk with your   health care provider if you have any questions about your results.   This information is not intended to replace advice given to you by your health care provider. Make sure you discuss any questions you have with your health care provider.   Document Released: 02/10/2005 Document Revised: 05/24/2014 Document Reviewed: 08/27/2013 [employer] Interactive Patient Education 2016 [employer]  

## 2015-05-31 ENCOUNTER — Ambulatory Visit (INDEPENDENT_AMBULATORY_CARE_PROVIDER_SITE_OTHER): Payer: 59

## 2015-05-31 DIAGNOSIS — Z111 Encounter for screening for respiratory tuberculosis: Secondary | ICD-10-CM

## 2015-05-31 LAB — TB SKIN TEST
INDURATION: 0 mm
TB SKIN TEST: NEGATIVE

## 2019-02-06 ENCOUNTER — Encounter: Payer: Self-pay | Admitting: Gynecology
